# Patient Record
Sex: Male | Born: 1990
Health system: Southern US, Community
[De-identification: ages and names within clinical notes are randomized; demographics above are authoritative.]

## PROBLEM LIST (undated history)

## (undated) ENCOUNTER — Emergency Department (HOSPITAL_COMMUNITY): Admission: EM | Discharge status: Absent without leave | Payer: Self-pay

## (undated) DIAGNOSIS — R519 Headache, unspecified: Secondary | ICD-10-CM

## (undated) DIAGNOSIS — R51 Headache: Secondary | ICD-10-CM

## (undated) HISTORY — DX: Headache: R51

## (undated) HISTORY — DX: Headache, unspecified: R51.9

---

## 2013-01-21 ENCOUNTER — Encounter (HOSPITAL_COMMUNITY): Payer: Self-pay

## 2013-01-21 ENCOUNTER — Emergency Department (HOSPITAL_COMMUNITY)
Admission: EM | Admit: 2013-01-21 | Discharge: 2013-01-21 | Disposition: A | Discharge status: Home / Self Care | Payer: Self-pay | Attending: Emergency Medicine | Admitting: Emergency Medicine

## 2013-01-21 DIAGNOSIS — R51 Headache: Secondary | ICD-10-CM | POA: Insufficient documentation

## 2013-01-21 DIAGNOSIS — K2970 Gastritis, unspecified, without bleeding: Secondary | ICD-10-CM | POA: Insufficient documentation

## 2013-01-21 LAB — CBC WITH DIFFERENTIAL/PLATELET
Eosinophils Absolute: 0.1 10*3/uL (ref 0.0–0.7)
Eosinophils Relative: 1 % (ref 0–5)
Lymphs Abs: 2.6 10*3/uL (ref 0.7–4.0)
MCH: 29.7 pg (ref 26.0–34.0)
MCHC: 33.3 g/dL (ref 30.0–36.0)
MCV: 89.4 fL (ref 78.0–100.0)
Platelets: 212 10*3/uL (ref 150–400)
RBC: 4.64 MIL/uL (ref 4.22–5.81)

## 2013-01-21 LAB — COMPREHENSIVE METABOLIC PANEL
ALT: 10 U/L (ref 0–53)
BUN: 5 mg/dL — ABNORMAL LOW (ref 6–23)
Calcium: 10 mg/dL (ref 8.4–10.5)
GFR calc Af Amer: >90 mL/min (ref >90)
Glucose, Bld: 112 mg/dL — ABNORMAL HIGH (ref 70–99)
Sodium: 139 mEq/L (ref 135–145)
Total Protein: 7.4 g/dL (ref 6.0–8.3)

## 2013-01-21 LAB — URINALYSIS W MICROSCOPIC + REFLEX CULTURE
Leukocytes, UA: NEGATIVE
Nitrite: NEGATIVE
Protein, ur: NEGATIVE mg/dL
Urine-Other: NONE SEEN
Urobilinogen, UA: 0.2 mg/dL (ref 0.0–1.0)

## 2013-01-21 LAB — LIPASE, BLOOD: Lipase: 16 U/L (ref 11–59)

## 2013-01-21 MED ORDER — ASPIRIN-ACETAMINOPHEN-CAFFEINE 250-250-65 MG PO TABS: 1.0000 | ORAL_TABLET | Freq: Four times a day (QID) | ORAL | Status: DC | PRN

## 2013-01-21 MED ORDER — ONDANSETRON 8 MG PO TBDP
8.0000 mg | ORAL_TABLET | Freq: Once | ORAL | Status: AC
  Administered 2013-01-21: 8 mg via ORAL
  Filled 2013-01-21: qty 1

## 2013-01-21 MED ORDER — GI COCKTAIL ~~LOC~~
30.0000 mL | Freq: Once | ORAL | Status: AC
  Administered 2013-01-21: 30 mL via ORAL
  Filled 2013-01-21: qty 30

## 2013-01-21 MED ORDER — FAMOTIDINE 20 MG PO TABS: 20.0000 mg | ORAL_TABLET | Freq: Two times a day (BID) | ORAL | Status: DC

## 2013-01-21 NOTE — ED Provider Notes (Signed)
Medical screening examination/treatment/procedure(s) were performed by non-physician practitioner and as supervising physician I was immediately available for consultation/collaboration. Devoria Albe, MD, Armando Gang   Ward Givens, MD 01/21/13 2001

## 2013-01-21 NOTE — ED Notes (Signed)
Per [facility]- pt reports stomach pain since this am. Headache x 2 days Motrin and tylenol without relief. C/o of nausea only.  Denies V/D and fever.

## 2013-01-21 NOTE — ED Provider Notes (Signed)
CSN: [id number]     Arrival date & time 01/21/13  1413 History   First MD Initiated Contact with Patient 01/21/13 1723     Chief Complaint  Patient presents with  . Abdominal Pain    this am  . Headache    x 2 days   (Consider location/radiation/quality/duration/timing/severity/associated sxs/prior Treatment) HPI Patient is a 22 year old male who presents to emergency department with complaint of headache and abdominal pain. Should states his headache began gradually several days ago. States yesterday he was taking Tylenol and Advil for pain. States took about 10 Tylenol as well as Tylenol sinus and took about 10 and had rales and states he had no improvement in his headache. Later on that night he developed generalized abdominal pain, nausea, loose bowels. States he is able to go to bed however this morning when he woke up he continued to have abdominal pain and headache. Patient states since being in the waiting room his headache and abdominal pain subsided. He denies any fever, chills, vomiting. Denies any blood in his stool. Denies any history of the same. He did not take any medications for his nausea. States nothing makes his symptoms better or worse. History reviewed. No pertinent past medical history. History reviewed. No pertinent past surgical history. No family history on file. History  Substance Use Topics  . Smoking status: Never Smoker   . Smokeless tobacco: Not on file  . Alcohol Use: No    Review of Systems  Constitutional: Negative for fever and chills.  HENT: Negative for neck pain and neck stiffness.   Respiratory: Negative for cough, chest tightness and shortness of breath.   Cardiovascular: Negative for chest pain, palpitations and leg swelling.  Gastrointestinal: Positive for nausea and abdominal pain. Negative for vomiting, diarrhea and abdominal distention.  Genitourinary: Negative for dysuria, urgency, frequency and hematuria.  Musculoskeletal: Negative for  myalgias and arthralgias.  Skin: Negative for rash.  Allergic/Immunologic: Negative for immunocompromised state.  Neurological: Positive for headaches. Negative for dizziness, weakness, light-headedness and numbness.    Allergies  Review of patient's allergies indicates no known allergies.  Home Medications   Current Outpatient Rx  Name  Route  Sig  Dispense  Refill  . ibuprofen (ADVIL,MOTRIN) 200 MG tablet   Oral   Take 800 mg by mouth every 8 (eight) hours as needed for pain.           BP 139/79  Pulse 72  Temp(Src) 98.6 F (37 C) (Oral)  Resp 16  Ht 5\' 8"  (1.727 m)  Wt 150 lb (68.04 kg)  BMI 22.81 kg/m2  SpO2 100% Physical Exam  Nursing note and vitals reviewed. Constitutional: He is oriented to person, place, and time. He appears well-developed and well-nourished. No distress.  HENT:  Head: Normocephalic and atraumatic.  Mouth/Throat: Oropharynx is clear and moist.  Eyes: Conjunctivae are normal.  Neck: Normal range of motion. Neck supple.  Cardiovascular: Normal rate, regular rhythm and normal heart sounds.   Pulmonary/Chest: Effort normal and breath sounds normal. No respiratory distress. He has no wheezes. He has no rales.  Abdominal: Soft. Bowel sounds are normal. He exhibits no distension. There is tenderness. There is no rebound and no guarding.  Epigastric tenderness  Musculoskeletal: He exhibits no edema and no tenderness.  Lymphadenopathy:    He has no cervical adenopathy.  Neurological: He is alert and oriented to person, place, and time.  Skin: Skin is warm and dry. No erythema.  Psychiatric: He has a normal mood  and affect.    ED Course  Procedures (including critical care time) Results for orders placed during the hospital encounter of 01/21/13  CBC WITH DIFFERENTIAL      Result Value Range   WBC 7.0  4.0 - 10.5 K/uL   RBC 4.64  4.22 - 5.81 MIL/uL   Hemoglobin 13.8  13.0 - 17.0 g/dL   HCT 16.1  09.6 - 04.5 %   MCV 89.4  78.0 - 100.0 fL   MCH  29.7  26.0 - 34.0 pg   MCHC 33.3  30.0 - 36.0 g/dL   RDW 40.9  81.1 - 91.4 %   Platelets 212  150 - 400 K/uL   Neutrophils Relative % 58  43 - 77 %   Neutro Abs 4.1  1.7 - 7.7 K/uL   Lymphocytes Relative 37  12 - 46 %   Lymphs Abs 2.6  0.7 - 4.0 K/uL   Monocytes Relative 3  3 - 12 %   Monocytes Absolute 0.2  0.1 - 1.0 K/uL   Eosinophils Relative 1  0 - 5 %   Eosinophils Absolute 0.1  0.0 - 0.7 K/uL   Basophils Relative 1  0 - 1 %   Basophils Absolute 0.1  0.0 - 0.1 K/uL  COMPREHENSIVE METABOLIC PANEL      Result Value Range   Sodium 139  135 - 145 mEq/L   Potassium 3.6  3.5 - 5.1 mEq/L   Chloride 102  96 - 112 mEq/L   CO2 31  19 - 32 mEq/L   Glucose, Bld 112 (*) 70 - 99 mg/dL   BUN 5 (*) 6 - 23 mg/dL   Creatinine, Ser 7.82  0.50 - 1.35 mg/dL   Calcium 95.6  8.4 - 21.3 mg/dL   Total Protein 7.4  6.0 - 8.3 g/dL   Albumin 4.1  3.5 - 5.2 g/dL   AST 20  0 - 37 U/L   ALT 10  0 - 53 U/L   Alkaline Phosphatase 63  39 - 117 U/L   Total Bilirubin 0.4  0.3 - 1.2 mg/dL   GFR calc non Af Amer >90  >90 mL/min   GFR calc Af Amer >90  >90 mL/min  LIPASE, BLOOD      Result Value Range   Lipase 16  11 - 59 U/L  ACETAMINOPHEN LEVEL      Result Value Range   Acetaminophen (Tylenol), Serum <15.0  10 - 30 ug/mL  URINALYSIS W MICROSCOPIC + REFLEX CULTURE      Result Value Range   Color, Urine YELLOW  YELLOW   APPearance CLEAR  CLEAR   Specific Gravity, Urine 1.013  1.005 - 1.030   pH 6.0  5.0 - 8.0   Glucose, UA 100 (*) NEGATIVE mg/dL   Hgb urine dipstick NEGATIVE  NEGATIVE   Bilirubin Urine NEGATIVE  NEGATIVE   Ketones, ur NEGATIVE  NEGATIVE mg/dL   Protein, ur NEGATIVE  NEGATIVE mg/dL   Urobilinogen, UA 0.2  0.0 - 1.0 mg/dL   Nitrite NEGATIVE  NEGATIVE   Leukocytes, UA NEGATIVE  NEGATIVE   Urine-Other       Value: NO FORMED ELEMENTS SEEN ON URINE MICROSCOPIC EXAMINATION   No results found.    MDM   1. Headache   2. Gastritis     A shunt with headache and epigastric  abdominal pain. He took approximately 10-15 Tylenol and 10 ibuprofen sister for his headache. His headache subsided last night however he returned  today. His headache is gradual in onset. It is not associated with any neurological symptoms. He has no meningismus. He is afebrile. Patient's abdominal pain is most likely due to overmedication and at present he does not have any pain. His abdomen is benign. He'll be discharged with pain medication for his headache we'll start him on Pepcid. He will followup with primary care Dr. Has also noted that he had some glucose in his urine his blood sugar here was in 112. Informed patient and will need recheck with primary care Dr.  Ceasar Mons Vitals:   01/21/13 1416 01/21/13 1424 01/21/13 1733 01/21/13 1914  BP:  139/79 117/92 113/72  Pulse:  72 64 62  Temp:  98.6 F (37 C) 97.6 F (36.4 C) 98.2 F (36.8 C)  TempSrc:  Oral Oral Oral  Resp:  16 18 18   Height:  5\' 8"  (1.727 m)    Weight:  150 lb (68.04 kg)    SpO2: 98% 100% 100% 100%       Lottie Mussel, PA-C 01/21/13 1955

## 2015-05-21 ENCOUNTER — Emergency Department (HOSPITAL_COMMUNITY)
Admission: EM | Admit: 2015-05-21 | Discharge: 2015-05-21 | Disposition: A | Discharge status: Home / Self Care | Payer: No Typology Code available for payment source | Attending: Emergency Medicine | Admitting: Emergency Medicine

## 2015-05-21 ENCOUNTER — Emergency Department (HOSPITAL_COMMUNITY): Payer: No Typology Code available for payment source

## 2015-05-21 DIAGNOSIS — Y92410 Unspecified street and highway as the place of occurrence of the external cause: Secondary | ICD-10-CM | POA: Diagnosis not present

## 2015-05-21 DIAGNOSIS — S199XXA Unspecified injury of neck, initial encounter: Secondary | ICD-10-CM | POA: Diagnosis not present

## 2015-05-21 DIAGNOSIS — Y9389 Activity, other specified: Secondary | ICD-10-CM | POA: Insufficient documentation

## 2015-05-21 DIAGNOSIS — Y998 Other external cause status: Secondary | ICD-10-CM | POA: Insufficient documentation

## 2015-05-21 DIAGNOSIS — S0990XA Unspecified injury of head, initial encounter: Secondary | ICD-10-CM | POA: Insufficient documentation

## 2015-05-21 DIAGNOSIS — S79922A Unspecified injury of left thigh, initial encounter: Secondary | ICD-10-CM | POA: Diagnosis not present

## 2015-05-21 DIAGNOSIS — M542 Cervicalgia: Secondary | ICD-10-CM

## 2015-05-21 DIAGNOSIS — M25562 Pain in left knee: Secondary | ICD-10-CM

## 2015-05-21 DIAGNOSIS — S8992XA Unspecified injury of left lower leg, initial encounter: Secondary | ICD-10-CM | POA: Insufficient documentation

## 2015-05-21 MED ORDER — CYCLOBENZAPRINE HCL 10 MG PO TABS: 10.0000 mg | ORAL_TABLET | Freq: Two times a day (BID) | ORAL | Status: DC | PRN

## 2015-05-21 MED ORDER — IBUPROFEN 800 MG PO TABS
800.0000 mg | ORAL_TABLET | Freq: Once | ORAL | Status: AC
  Administered 2015-05-21: 800 mg via ORAL
  Filled 2015-05-21: qty 1
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
## 2015-05-21 NOTE — ED Notes (Signed)
Pt verbalized understanding of d/c instructions, prescriptions, and follow-up care. No further questions/concerns, VSS, ambulatory w/ steady gait (refused wheelchair) 
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
## 2015-07-28 ENCOUNTER — Encounter (HOSPITAL_COMMUNITY): Payer: Self-pay | Admitting: Vascular Surgery

## 2015-07-28 ENCOUNTER — Emergency Department (HOSPITAL_COMMUNITY): Payer: No Typology Code available for payment source

## 2015-07-28 ENCOUNTER — Emergency Department (HOSPITAL_COMMUNITY)
Admission: EM | Admit: 2015-07-28 | Discharge: 2015-07-28 | Disposition: A | Discharge status: Home / Self Care | Payer: No Typology Code available for payment source | Attending: Emergency Medicine | Admitting: Emergency Medicine

## 2015-07-28 DIAGNOSIS — S60221A Contusion of right hand, initial encounter: Secondary | ICD-10-CM | POA: Insufficient documentation

## 2015-07-28 DIAGNOSIS — S62316A Displaced fracture of base of fifth metacarpal bone, right hand, initial encounter for closed fracture: Secondary | ICD-10-CM | POA: Insufficient documentation

## 2015-07-28 DIAGNOSIS — Y9389 Activity, other specified: Secondary | ICD-10-CM | POA: Insufficient documentation

## 2015-07-28 DIAGNOSIS — Y9289 Other specified places as the place of occurrence of the external cause: Secondary | ICD-10-CM | POA: Insufficient documentation

## 2015-07-28 DIAGNOSIS — S62306A Unspecified fracture of fifth metacarpal bone, right hand, initial encounter for closed fracture: Secondary | ICD-10-CM

## 2015-07-28 DIAGNOSIS — W010XXA Fall on same level from slipping, tripping and stumbling without subsequent striking against object, initial encounter: Secondary | ICD-10-CM | POA: Insufficient documentation

## 2015-07-28 DIAGNOSIS — Y999 Unspecified external cause status: Secondary | ICD-10-CM | POA: Insufficient documentation

## 2015-07-28 DIAGNOSIS — S62324A Displaced fracture of shaft of fourth metacarpal bone, right hand, initial encounter for closed fracture: Secondary | ICD-10-CM | POA: Insufficient documentation

## 2015-07-28 DIAGNOSIS — S62304A Unspecified fracture of fourth metacarpal bone, right hand, initial encounter for closed fracture: Secondary | ICD-10-CM

## 2015-07-28 MED ORDER — IBUPROFEN 800 MG PO TABS: 800.0000 mg | ORAL_TABLET | Freq: Three times a day (TID) | ORAL | Status: DC

## 2015-07-28 MED ORDER — OXYCODONE-ACETAMINOPHEN 5-325 MG PO TABS: 1.0000 | ORAL_TABLET | ORAL | Status: DC | PRN

## 2015-07-28 MED ORDER — OXYCODONE-ACETAMINOPHEN 5-325 MG PO TABS
2.0000 | ORAL_TABLET | Freq: Once | ORAL | Status: AC
  Administered 2015-07-28: 2 via ORAL
  Filled 2015-07-28: qty 2

## 2015-07-28 NOTE — Progress Notes (Signed)
Orthopedic Tech Progress Note Patient Details:  Shawn MoloneyJefferson Winberg 03/22/1991 [id number]  Ortho Devices Type of Ortho Device: Ace wrap, Ulna gutter splint Ortho Device/Splint Location: rue Ortho Device/Splint Interventions: Application   Francoise Chojnowski 07/28/2015, 5:16 PM

## 2015-07-28 NOTE — Discharge Instructions (Signed)
Metacarpal Fracture °A metacarpal fracture is a break (fracture) of a bone in the hand. Metacarpals are the bones that extend from your knuckles to your wrist. In each hand, you have five metacarpal bones that connect your fingers and your thumb to your wrist. °Some hand fractures have bone pieces that are close together and stable (simple). These fractures may be treated with only a splint or cast. Hand fractures that have many pieces of broken bone (comminuted), unstable bone pieces (displaced), or a bone that breaks through the skin (compound) usually require surgery. °CAUSES °This injury may be caused by: °· A fall. °· A hard, direct hit to your hand. °· An injury that squeezes your knuckle, stretches your finger out of place, or crushes your hand. °RISK FACTORS °This injury is more likely to occur if: °· You play contact sports. °· You have certain bone diseases. °SYMPTOMS  °Symptoms of this type of fracture develop soon after the injury. Symptoms may include: °· Swelling. °· Pain. °· Stiffness. °· Increased pain with movement. °· Bruising. °· Inability to move a finger. °· A shortened finger. °· A finger knuckle that looks sunken in. °· Unusual appearance of the hand or finger (deformity). °DIAGNOSIS  °This injury may be diagnosed based on your signs and symptoms, especially if you had a recent hand injury. Your health care provider will perform a physical exam. He or she may also order X-rays to confirm the diagnosis.  °TREATMENT  °Treatment for this injury depends on the type of fracture you have and how severe it is. Possible treatments include: °· Non-reduction. This can be done if the bone does not need to be moved back into place. The fracture can be casted or splinted as it is.   °· Closed reduction. If your bone is stable and can be moved back into place, you may only need to wear a cast or splint or have buddy taping. °· Closed reduction with internal fixation (CRIF). This is the most common  treatment. You may have this procedure if your bone can be moved back into place but needs more support. Wires, pins, or screws may be inserted through your skin to stabilize the fracture. °· Open reduction with internal fixation (ORIF). This may be needed if your fracture is severe and unstable. It involves surgery to move your bone back into the right position. Screws, wires, or plates are used to stabilize the fracture. °After all procedures, you may need to wear a cast or a splint for several weeks. You will also need to have follow-up X-rays to make sure that the bone is healing well and staying in position. After you no longer need your cast or splint, you may need physical therapy. This will help you to regain full movement and strength in your hand.  °HOME CARE INSTRUCTIONS  °If You Have a Cast: °· Do not stick anything inside the cast to scratch your skin. Doing that increases your risk of infection. °· Check the skin around the cast every day. Report any concerns to your health care provider. You may put lotion on dry skin around the edges of the cast. Do not apply lotion to the skin underneath the cast. °If You Have a Splint: °· Wear it as directed by your health care provider. Remove it only as directed by your health care provider. °· Loosen the splint if your fingers become numb and tingle, or if they turn cold and blue. °Bathing °· Cover the cast or splint with a   watertight plastic bag to protect it from water while you take a bath or a shower. Do not let the cast or splint get wet. Managing Pain, Stiffness, and Swelling  If directed, apply ice to the injured area (if you have a splint, not a cast):  Put ice in a plastic bag.  Place a towel between your skin and the bag.  Leave the ice on for 20 minutes, 2-3 times a day.  Move your fingers often to avoid stiffness and to lessen swelling.  Raise the injured area above the level of your heart while you are sitting or lying  down. Driving  Do not drive or operate heavy machinery while taking pain medicine.  Do not drive while wearing a cast or splint on a hand that you use for driving. Activity  Return to your normal activities as directed by your health care provider. Ask your health care provider what activities are safe for you. General Instructions  Do not put pressure on any part of the cast or splint until it is fully hardened. This may take several hours.  Keep the cast or splint clean and dry.  Do not use any tobacco products, including cigarettes, chewing tobacco, or electronic cigarettes. Tobacco can delay bone healing. If you need help quitting, ask your health care provider.  Take medicines only as directed by your health care provider.  Keep all follow-up visits as directed by your health care provider. This is important. SEEK MEDICAL CARE IF:   Your pain is getting worse.  You have redness, swelling, or pain in the injured area.   You have fluid, blood, or pus coming from under your cast or splint.   You notice a bad smell coming from under your cast or splint.   You have a fever.  SEEK IMMEDIATE MEDICAL CARE IF:   You develop a rash.   You have trouble breathing.   Your skin or nails on your injured hand turn blue or gray even after you loosen your splint.  Your injured hand feels cold or becomes numb even after you loosen your splint.   You develop severe pain under the cast or in your hand.   This information is not intended to replace advice given to you by your health care provider. Make sure you discuss any questions you have with your health care provider.   Document Released: 05/08/2005 Document Revised: 01/27/2015 Document Reviewed: 02/25/2014 Elsevier Interactive Patient Education 2016 [employer] Inc[employer]  Cast or Splint Care Casts and splints support injured limbs and keep bones from moving while they heal. It is important to care for your cast or splint at  home.  HOME CARE INSTRUCTIONS  Keep the cast or splint uncovered during the drying period. It can take 24 to 48 hours to dry if it is made of plaster. A fiberglass cast will dry in less than 1 hour.  Do not rest the cast on anything harder than a pillow for the first 24 hours.  Do not put weight on your injured limb or apply pressure to the cast until your health care provider gives you permission.  Keep the cast or splint dry. Wet casts or splints can lose their shape and may not support the limb as well. A wet cast that has lost its shape can also create harmful pressure on your skin when it dries. Also, wet skin can become infected.  Cover the cast or splint with a plastic bag when bathing or when out in  the rain or snow. If the cast is on the trunk of the body, take sponge baths until the cast is removed.  If your cast does become wet, dry it with a towel or a blow dryer on the cool setting only.  Keep your cast or splint clean. Soiled casts may be wiped with a moistened cloth.  Do not place any hard or soft foreign objects under your cast or splint, such as cotton, toilet paper, lotion, or powder.  Do not try to scratch the skin under the cast with any object. The object could get stuck inside the cast. Also, scratching could lead to an infection. If itching is a problem, use a blow dryer on a cool setting to relieve discomfort.  Do not trim or cut your cast or remove padding from inside of it.  Exercise all joints next to the injury that are not immobilized by the cast or splint. For example, if you have a long leg cast, exercise the hip joint and toes. If you have an arm cast or splint, exercise the shoulder, elbow, thumb, and fingers.  Elevate your injured arm or leg on 1 or 2 pillows for the first 1 to 3 days to decrease swelling and pain.It is best if you can comfortably elevate your cast so it is higher than your heart. SEEK MEDICAL CARE IF:   Your cast or splint  cracks.  Your cast or splint is too tight or too loose.  You have unbearable itching inside the cast.  Your cast becomes wet or develops a soft spot or area.  You have a bad smell coming from inside your cast.  You get an object stuck under your cast.  Your skin around the cast becomes red or raw.  You have new pain or worsening pain after the cast has been applied. SEEK IMMEDIATE MEDICAL CARE IF:   You have fluid leaking through the cast.  You are unable to move your fingers or toes.  You have discolored (blue or white), cool, painful, or very swollen fingers or toes beyond the cast.  You have tingling or numbness around the injured area.  You have severe pain or pressure under the cast.  You have any difficulty with your breathing or have shortness of breath.  You have chest pain.   This information is not intended to replace advice given to you by your health care provider. Make sure you discuss any questions you have with your health care provider.   Document Released: 05/05/2000 Document Revised: 02/26/2013 Document Reviewed: 11/14/2012 Elsevier Interactive Patient Education 2016 Else[employer] Inc[employer] RICE for Routine Care of Injuries Theroutine careofmanyinjuriesincludes rest, ice, compression, and elevation (RICE therapy). RICE therapy is often recommended for injuries to soft tissues, such as a muscle strain, ligament injuries, bruises, and overuse injuries. It can also be used for some bony injuries. Using RICE therapy can help to relieve pain, lessen swelling, and enable your body to heal. Rest Rest is required to allow your body to heal. This usually involves reducing your normal activities and avoiding use of the injured part of your body. Generally, you can return to your normal activities when you are comfortable and have been given permission by your health care provider. Ice Icing your injury helps to keep the swelling down, and it lessens pain. Do not apply ice  directly to your skin.  Put ice in a plastic bag.  Place a towel between your skin and the bag.  Leave the ice on  for 20 minutes, 2-3 times a day. Do this for as long as you are directed by your health care provider. Compression Compression means putting pressure on the injured area. Compression helps to keep swelling down, gives support, and helps with discomfort. Compression may be done with an elastic bandage. If an elastic bandage has been applied, follow these general tips:  Remove and reapply the bandage every 3-4 hours or as directed by your health care provider.  Make sure the bandage is not wrapped too tightly, because this can cut off circulation. If part of your body beyond the bandage becomes blue, numb, cold, swollen, or more painful, your bandage is most likely too tight. If this occurs, remove your bandage and reapply it more loosely.  See your health care provider if the bandage seems to be making your problems worse rather than better. Elevation Elevation means keeping the injured area raised. This helps to lessen swelling and decrease pain. If possible, your injured area should be elevated at or above the level of your heart or the center of your chest. WHEN SHOULD I SEEK MEDICAL CARE? You should seek medical care if:  Your pain and swelling continue.  Your symptoms are getting worse rather than improving. These symptoms may indicate that further evaluation or further X-rays are needed. Sometimes, X-rays may not show a small broken bone (fracture) until a number of days later. Make a follow-up appointment with your health care provider. WHEN SHOULD I SEEK IMMEDIATE MEDICAL CARE? You should seek immediate medical care if:  You have sudden severe pain at or below the area of your injury.  You have redness or increased swelling around your injury.  You have tingling or numbness at or below the area of your injury that does not improve after you remove the elastic  bandage.   This information is not intended to replace advice given to you by your health care provider. Make sure you discuss any questions you have with your health care provider.   Document Released: 08/20/2000 Document Revised: 01/27/2015 Document Reviewed: 04/15/2014 Elsevier Interactive Patient Education [employer]

## 2015-07-28 NOTE — ED Provider Notes (Signed)
CSN: [id number]     Arrival date & time 07/28/15  1447 History  By signing my name below, I, Emmanuella Mensah, attest that this documentation has been prepared under the direction and in the presence of Danelle Berry, PA-C. Electronically Signed: Angelene Giovanni, ED Scribe. 07/28/2015. 4:19 PM.    Chief Complaint  Patient presents with  . Hand Pain   The history is provided by the patient. No language interpreter was used.   HPI Comments: Shawn Werner is a 25 y.o. male who presents to the Emergency Department complaining of gradually worsening constant 7/10 right hand pain s/p fall that occurred last night. He reports associated swelling and decreased ROM secondary to pain. He explains that he was carrying something in his hand when he missed a step, causing him to fall on his right hand. He states that he took Ibuprofen with no relief. He denies any fever, open wounds, numbness, tingling, or weakness.    History reviewed. No pertinent past medical history. History reviewed. No pertinent past surgical history. No family history on file. Social History  Substance Use Topics  . Smoking status: Never Smoker   . Smokeless tobacco: Never Used  . Alcohol Use: Yes     Comment: occasionally    Review of Systems  Constitutional: Negative for fever and chills.  Musculoskeletal: Positive for joint swelling (right hand) and arthralgias (right hand).  Skin: Negative for wound.  Neurological: Negative for weakness and numbness.  All other systems reviewed and are negative.     Allergies  Review of patient's allergies indicates no known allergies.  Home Medications   Prior to Admission medications   Medication Sig Start Date End Date Taking? Authorizing Provider  acetaminophen (TYLENOL) 325 MG tablet Take 650 mg by mouth every 6 (six) hours as needed for headache (pain).    Historical Provider, MD  cyclobenzaprine (FLEXERIL) 10 MG tablet Take 1 tablet (10 mg total) by mouth 2 (two)  times daily as needed for muscle spasms. 05/21/15   Eyvonne Mechanic, PA-C  ibuprofen (ADVIL,MOTRIN) 800 MG tablet Take 1 tablet (800 mg total) by mouth 3 (three) times daily. 07/28/15   Danelle Berry, PA-C  oxyCODONE-acetaminophen (PERCOCET) 5-325 MG tablet Take 1 tablet by mouth every 4 (four) hours as needed. 07/28/15   Danelle Berry, PA-C   BP 122/75 mmHg  Pulse 83  Temp(Src) 97.5 F (36.4 C) (Oral)  Resp 14  SpO2 99% Physical Exam  Constitutional: He is oriented to person, place, and time. He appears well-developed and well-nourished. No distress.  HENT:  Head: Normocephalic and atraumatic.  Eyes: Conjunctivae and EOM are normal.  Neck: Neck supple. No tracheal deviation present.  Cardiovascular: Normal rate.   Pulmonary/Chest: Effort normal. No respiratory distress.  Musculoskeletal:       Right hand: He exhibits decreased range of motion, tenderness, bony tenderness and swelling. He exhibits normal capillary refill. Normal sensation noted.  Right hand: Normal cap refill, Normal sensation to light touch, 2+ radial pulse and palpable ulnar pulse.   Neurological: He is alert and oriented to person, place, and time.  Skin: Skin is warm and dry.  Psychiatric: He has a normal mood and affect. His behavior is normal.  Nursing note and vitals reviewed.   ED Course  Procedures (including critical care time) DIAGNOSTIC STUDIES: Oxygen Saturation is 97% on RA, normal by my interpretation.    COORDINATION OF CARE: 4:17 PM- Pt advised of plan for treatment and pt agrees. Pt informed of his x-ray results.  He will receive Percocet and immobilizer. Advised to ice it and keep arm elevated. He provide resources to follow up with Ortho for further treatment. Pt will receive a work note.    Imaging Review Dg Hand Complete Right  07/28/2015  CLINICAL DATA:  Larey SeatFell last the, injury right hand. Pain, swelling and bruising EXAM: RIGHT HAND - COMPLETE 3+ VIEW COMPARISON:  None FINDINGS: Fractures are noted  through the midportion of the right fourth metacarpal and proximal the right fifth metacarpal. Mild displacement. Overlying soft tissue swelling. IMPRESSION: Fractures through the fourth and fifth metacarpals. Electronically Signed   By: Charlett NoseKevin  Dover M.D.   On: 07/28/2015 15:32     Danelle BerryLeisa Dymon [place], PA-C has personally reviewed and evaluated these images as part of her medical decision-making.  MDM   Right hand injuryFrom a mechanical fall that occurred last night. Patient states he was carrying a bunch of stuff when he tripped and fell and his right arm landed underneath him he cannot describe the mechanism and the more details than that. Patient's x-rays reveal a mildly displaced right fourth and fifth metacarpal fracture. He is neurovascularly intact with diffuse swelling, bruising and tenderness.  He was placed in a ulnar gutter splint in the ER, given pain medications and ortho follow-up. He was discharged in good condition  Final diagnoses:  Fracture of fifth metacarpal bone of right hand, closed, initial encounter  Fracture of fourth metacarpal bone of right hand, closed, initial encounter   I personally performed the services described in this documentation, which was scribed in my presence. The recorded information has been reviewed and is accurate.     Danelle BerryLeisa Per Beagley, PA-C 07/30/15 1805  Alvira MondayErin Schlossman, MD 07/30/15 1952

## 2015-07-28 NOTE — ED Notes (Signed)
Pt reports to the ED for eval of right hand pain and swelling following a fall that occurred last pm. Right hand significantly swollen. CMS intact. Finger movement limited by swelling. Pt A&Ox4 resp e/u, and skin warm and dry.

## 2015-07-28 NOTE — ED Notes (Signed)
Pt ambulates independently and with steady gait at time of discharge. Discharge instructions and follow up information reviewed with patient. No other questions or concerns voiced at this time. RX x 2. 

## 2015-08-16 ENCOUNTER — Ambulatory Visit: Payer: Self-pay | Attending: Internal Medicine

## 2015-08-19 ENCOUNTER — Ambulatory Visit: Payer: Self-pay | Attending: Physician Assistant | Admitting: Physician Assistant

## 2015-08-19 VITALS — BP 112/77 | HR 66 | Temp 97.9°F | Resp 18 | Ht 69.0 in | Wt 181.0 lb

## 2015-08-19 DIAGNOSIS — S62606A Fracture of unspecified phalanx of right little finger, initial encounter for closed fracture: Secondary | ICD-10-CM | POA: Insufficient documentation

## 2015-08-19 DIAGNOSIS — Z79899 Other long term (current) drug therapy: Secondary | ICD-10-CM | POA: Insufficient documentation

## 2015-08-19 DIAGNOSIS — S62604A Fracture of unspecified phalanx of right ring finger, initial encounter for closed fracture: Secondary | ICD-10-CM | POA: Insufficient documentation

## 2015-08-19 DIAGNOSIS — S62308A Unspecified fracture of other metacarpal bone, initial encounter for closed fracture: Secondary | ICD-10-CM

## 2015-08-19 DIAGNOSIS — W19XXXA Unspecified fall, initial encounter: Secondary | ICD-10-CM | POA: Insufficient documentation

## 2015-08-19 NOTE — Patient Instructions (Signed)
Keep fingers wrapped until see by ortho Our office will be in touch with an appt

## 2015-08-19 NOTE — Progress Notes (Signed)
Patient is here for ED FU for R Metacarpal Fx  Patient denies pain at this time.  Patient has not taken any medications today.  Patient declined the flu shot

## 2015-08-19 NOTE — Progress Notes (Signed)
Shawn MoloneyJefferson Dade  ZOX:[id number]  [id number]  DOB - 03/19/1991  Chief Complaint  Patient presents with  . Follow-up    Right Metacarpal Fx       Subjective:   Shawn Werner is a 25 y.o. male here today for establishment of care. He presented to the emergency department on 07/28/2015 after sustaining a right hand injury from a mechanical fall that occurred the night prior. Patient states he was carrying a bunch of stuff when he tripped and fell and his right arm landed underneath him. He cannot describe the mechanism and the more details than that. Patient's x-rays revealed a mildly displaced right fourth and fifth metacarpal fracture of the right hand. He was neurovascularly intact with diffuse swelling, bruising and tenderness. He was placed in a ulnar gutter splint in the ER and given pain medications and told to come here for an ortho referral. He also was given NSAIDS and Percocet.   He did get the prescriptions filled. Feels a lot better. He is kept illness point on. He is currently not working. The pain is reduced to a 2 on a scale of 1-10. He still has decreased range of motion.  ROS: GEN: denies fever or chills, denies change in weight Skin: denies lesions or rashes EXT: denies muscle spasms + swelling; + pain in lower ext, no weakness NEURO: denies numbness or tingling, denies sz, stroke or TIA  ALLERGIES: No Known Allergies  PAST MEDICAL HISTORY: History reviewed. No pertinent past medical history.  PAST SURGICAL HISTORY: History reviewed. No pertinent past surgical history.  MEDICATIONS AT HOME: Prior to Admission medications   Medication Sig Start Date End Date Taking? Authorizing Provider  acetaminophen (TYLENOL) 325 MG tablet Take 650 mg by mouth every 6 (six) hours as needed for headache (pain).   Yes Historical Provider, MD  cyclobenzaprine (FLEXERIL) 10 MG tablet Take 1 tablet (10 mg total) by mouth 2 (two) times daily as needed for muscle  spasms. 05/21/15  Yes Jeffrey Hedges, PA-C  ibuprofen (ADVIL,MOTRIN) 800 MG tablet Take 1 tablet (800 mg total) by mouth 3 (three) times daily. 07/28/15  Yes Danelle BerryLeisa Tapia, PA-C  oxyCODONE-acetaminophen (PERCOCET) 5-325 MG tablet Take 1 tablet by mouth every 4 (four) hours as needed. 07/28/15  Yes Danelle BerryLeisa Tapia, PA-C     Objective:   Filed Vitals:   08/19/15 0941  BP: 112/77  Pulse: 66  Temp: 97.9 F (36.6 C)  TempSrc: Oral  Resp: 18  Height: 5\' 9"  (1.753 m)  Weight: 181 lb (82.101 kg)  SpO2: 100%    Exam General appearance : Awake, alert, not in any distress. Speech Clear. Not toxic looking HEENT: Atraumatic and Normocephalic, pupils equally reactive to light and accomodation Extremities: diffuse edema improved of right hand. Decreased ROM, bruising improved.  Neurology: Awake alert, and oriented X 3, CN II-XII intact, Non focal Wounds:N/A   Assessment & Plan  1. 4th and 5th Metacarpal Fracture of Right Hand   Cont Ulnar gutter splint Cont NSAIDS and prn pain meds Ortho referral Currently not working Return in about 4 weeks (around 09/16/2015). For routine health maintenance.  The patient was given clear instructions to go to ER or return to medical center if symptoms don't improve, worsen or new problems develop. The patient verbalized understanding. The patient was told to call to get lab results if they haven't heard anything in the next week.   This note has been created with Education officer, [employer]Dragon speech recognition software and smart phrase technology. Any transcriptional  errors are unintentional.    Scot Jun, PA-C [facility] [place], [place] [phone]   08/19/2015, 9:53 AM

## 2015-08-24 ENCOUNTER — Ambulatory Visit: Payer: Self-pay | Attending: Internal Medicine | Admitting: Internal Medicine

## 2015-08-24 ENCOUNTER — Encounter: Payer: Self-pay | Admitting: Internal Medicine

## 2015-08-24 VITALS — BP 122/79 | HR 67 | Temp 98.0°F | Resp 16 | Ht 69.0 in | Wt 177.0 lb

## 2015-08-24 DIAGNOSIS — S62309S Unspecified fracture of unspecified metacarpal bone, sequela: Secondary | ICD-10-CM

## 2015-08-24 MED ORDER — CYCLOBENZAPRINE HCL 5 MG PO TABS: 5.0000 mg | ORAL_TABLET | Freq: Three times a day (TID) | ORAL | Status: DC | PRN

## 2015-08-24 NOTE — Patient Instructions (Signed)
-   appt to ortho pending completion of [facility] ppwk - take motrin with food to prevent ulcers!!

## 2015-08-24 NOTE — Progress Notes (Signed)
   Shawn MoloneyJefferson Atchley, is a 25 y.o. male  ZOX:[id number]  [id number]  DOB - 03/10/1991  Chief Complaint  Patient presents with  . Hand Pain        Subjective:   Shawn Werner is a 25 y.o. male here today for a follow up visit.  Last seen in clinic 08/19/15 for 4th and 5th right MCP fracture after falling on his hand.  Back again for fu. Still working on his [facility], has not been able to see Ortho until that set up.  He does have his [employer] card now.   Pain tolerable, he took a motrin 800mg  this morning which helped.   Patient has No headache, No chest pain, No abdominal pain - No Nausea, No new weakness tingling or numbness, No Cough - SOB.  No problems updated.  ALLERGIES: No Known Allergies  PAST MEDICAL HISTORY: History reviewed. No pertinent past medical history.  MEDICATIONS AT HOME: Prior to Admission medications   Medication Sig Start Date End Date Taking? Authorizing Provider  acetaminophen (TYLENOL) 325 MG tablet Take 650 mg by mouth every 6 (six) hours as needed for headache (pain).    Historical Provider, MD  cyclobenzaprine (FLEXERIL) 5 MG tablet Take 1 tablet (5 mg total) by mouth 3 (three) times daily as needed for muscle spasms. 08/24/15   Pete Glatterawn T Langeland, MD  ibuprofen (ADVIL,MOTRIN) 800 MG tablet Take 1 tablet (800 mg total) by mouth 3 (three) times daily. 07/28/15   Danelle BerryLeisa Tapia, PA-C  oxyCODONE-acetaminophen (PERCOCET) 5-325 MG tablet Take 1 tablet by mouth every 4 (four) hours as needed. 07/28/15   Danelle BerryLeisa Tapia, PA-C     Objective:   Filed Vitals:   08/24/15 1443  BP: 122/79  Pulse: 67  Temp: 98 F (36.7 C)  Resp: 16  Height: 5\' 9"  (1.753 m)  Weight: 177 lb (80.287 kg)  SpO2: 100%    Exam General appearance : Awake, alert, not in any distress. Speech Clear. Not toxic looking, young.  HEENT: Atraumatic and Normocephalic, pupils equally reactive to light. Neck: supple, no JVD.  Chest:Good air entry bilaterally, no added  sounds. CVS: S1 S2 regular, no murmurs/gallups or rubs. Abdomen: Bowel sounds active, Non tender and not distended with no gaurding, rigidity or rebound. Extremities: B/L Lower Ext shows no edema, both legs are warm to touch.   Right hand in dressing/ Neurology: Awake alert, and oriented X 3, CN II-XII grossly intact, Non focal Skin:No Rash  Data Review No results found for: HGBA1C   Assessment & Plan   1) 4 & 5th metacarpal, right hand fracture - referral pending to ortho hand once pt completes his financial packet. - financial srvices, referral specialist assisting patient. - continue motrin prn w/ food for pain, added flexeril 5mg  prn for muscle spasms     Return in about 4 weeks (around 09/21/2015).  - pt states he has appt already set up for end of month for phsysical.  The patient was given clear instructions to go to ER or return to medical center if symptoms don't improve, worsen or new problems develop. The patient verbalized understanding. The patient was told to call to get lab results if they haven't heard anything in the next week.    Pete Glatterawn T Langeland, MD, MBA/MHA [facility] [place], [place] [phone]   08/24/2015, 3:21 PM

## 2015-08-24 NOTE — Progress Notes (Signed)
Patient complains of right hand pain  Fracture two bones in his hand about three weeks ago Currently has no pain medication

## 2015-09-13 ENCOUNTER — Ambulatory Visit: Payer: Self-pay | Admitting: Internal Medicine

## 2015-10-12 ENCOUNTER — Encounter: Payer: Self-pay | Admitting: Internal Medicine

## 2015-10-12 ENCOUNTER — Ambulatory Visit: Payer: Self-pay | Attending: Internal Medicine | Admitting: Internal Medicine

## 2015-10-12 VITALS — BP 113/71 | HR 69 | Temp 97.9°F | Wt 178.8 lb

## 2015-10-12 DIAGNOSIS — Z79891 Long term (current) use of opiate analgesic: Secondary | ICD-10-CM | POA: Insufficient documentation

## 2015-10-12 DIAGNOSIS — Z0000 Encounter for general adult medical examination without abnormal findings: Secondary | ICD-10-CM | POA: Insufficient documentation

## 2015-10-12 DIAGNOSIS — Z791 Long term (current) use of non-steroidal anti-inflammatories (NSAID): Secondary | ICD-10-CM | POA: Insufficient documentation

## 2015-10-12 DIAGNOSIS — S6291XD Unspecified fracture of right wrist and hand, subsequent encounter for fracture with routine healing: Secondary | ICD-10-CM | POA: Insufficient documentation

## 2015-10-12 DIAGNOSIS — X58XXXD Exposure to other specified factors, subsequent encounter: Secondary | ICD-10-CM | POA: Insufficient documentation

## 2015-10-12 DIAGNOSIS — Z79899 Other long term (current) drug therapy: Secondary | ICD-10-CM | POA: Insufficient documentation

## 2015-10-12 DIAGNOSIS — S6291XS Unspecified fracture of right wrist and hand, sequela: Secondary | ICD-10-CM

## 2015-10-12 DIAGNOSIS — K029 Dental caries, unspecified: Secondary | ICD-10-CM | POA: Insufficient documentation

## 2015-10-12 LAB — LIPID PANEL
Cholesterol: 204 mg/dL — ABNORMAL HIGH (ref 125–200)
HDL: 61 mg/dL (ref >=40)
LDL CALC: 123 mg/dL (ref <130)
Total CHOL/HDL Ratio: 3.3 Ratio (ref <=5.0)
Triglycerides: 102 mg/dL (ref <150)
VLDL: 20 mg/dL (ref <30)

## 2015-10-12 LAB — CBC
HEMATOCRIT: 44.8 % (ref 38.5–50.0)
HEMOGLOBIN: 14.8 g/dL (ref 13.2–17.1)
MCH: 29.2 pg (ref 27.0–33.0)
MCHC: 33 g/dL (ref 32.0–36.0)
MCV: 88.4 fL (ref 80.0–100.0)
MPV: 11.1 fL (ref 7.5–12.5)
Platelets: 276 10*3/uL (ref 140–400)
RBC: 5.07 MIL/uL (ref 4.20–5.80)
RDW: 13.9 % (ref 11.0–15.0)
WBC: 6.8 10*3/uL (ref 3.8–10.8)

## 2015-10-12 LAB — BASIC METABOLIC PANEL WITH GFR
BUN: 5 mg/dL — ABNORMAL LOW (ref 7–25)
CALCIUM: 10.5 mg/dL — AB (ref 8.6–10.3)
CO2: 28 mmol/L (ref 20–31)
CREATININE: 0.84 mg/dL (ref 0.60–1.35)
Chloride: 101 mmol/L (ref 98–110)
GFR, Est African American: >89 mL/min (ref >=60)
GFR, Est Non African American: >89 mL/min (ref >=60)
GLUCOSE: 75 mg/dL (ref 65–99)
Potassium: 4.4 mmol/L (ref 3.5–5.3)
SODIUM: 139 mmol/L (ref 135–146)

## 2015-10-12 NOTE — Progress Notes (Signed)
Shawn Werner, is a 25 y.o. male  [id number]  [id number]  DOB - 02/05/1991  Chief Complaint  Patient presents with  . Follow-up    R hand bone fx - doing much better  . Referral    Dental        Subjective:   Shawn Werner is a 25 y.o. male here today for a follow up visit of right hand fracture. He is doing very well, saw Orthopedic Doctor 2 wks ago, they took off the cast and told him he would not need surgery.  He denies any pain and able to use hand now w/o difficulty.  Pt o/w doing well, no acute complaints.  Asked to see dentist for chkup, thought he may need braces as well.  Doesn't smoke or drink.  Patient has No headache, No chest pain, No abdominal pain - No Nausea, No new weakness tingling or numbness, No Cough - SOB.  No problems updated.  ALLERGIES: No Known Allergies  PAST MEDICAL HISTORY: No past medical history on file.  MEDICATIONS AT HOME: Prior to Admission medications   Medication Sig Start Date End Date Taking? Authorizing Provider  acetaminophen (TYLENOL) 325 MG tablet Take 650 mg by mouth every 6 (six) hours as needed for headache (pain).   Yes Historical Provider, MD  ibuprofen (ADVIL,MOTRIN) 800 MG tablet Take 1 tablet (800 mg total) by mouth 3 (three) times daily. 07/28/15  Yes Danelle BerryLeisa Tapia, PA-C  cyclobenzaprine (FLEXERIL) 5 MG tablet Take 1 tablet (5 mg total) by mouth 3 (three) times daily as needed for muscle spasms. Patient not taking: Reported on 10/12/2015 08/24/15   Pete Glatterawn T Maverick Dieudonne, MD  oxyCODONE-acetaminophen (PERCOCET) 5-325 MG tablet Take 1 tablet by mouth every 4 (four) hours as needed. Patient not taking: Reported on 10/12/2015 07/28/15   Danelle BerryLeisa Tapia, PA-C     Objective:   Filed Vitals:   10/12/15 1132  BP: 113/71  Pulse: 69  Temp: 97.9 F (36.6 C)  TempSrc: Oral  Weight: 178 lb 12.8 oz (81.103 kg)    Exam General appearance : Awake, alert, not in any distress. Speech Clear. Not toxic looking, thin,  pleasant. HEENT: Atraumatic and Normocephalic, pupils equally reactive to light. Neck: supple, no JVD. Marland Kitchen.  Chest:Good air entry bilaterally, no added sounds. CVS: S1 S2 regular, no murmurs/gallups or rubs. Abdomen: Bowel sounds active, Non tender  Extremities: B/L Lower Ext shows no edema, both legs are warm to touch Neurology: Awake alert, and oriented X 3, CN II-XII grossly intact, Non focal Skin:No Rash  Data Review No results found for: HGBA1C  Dep[facility] screen [facility] 2/9 10/12/2015 08/19/2015  Decreased Interest 0 0  Down, Depressed, Hopeless 0 0  PHQ - 2 Score 0 0  Altered sleeping 0 -  Tired, decreased energy 0 -  Change in appetite 0 -  Feeling bad or failure about yourself  0 -  Trouble concentrating 0 -  Moving slowly or fidgety/restless 0 -  Suicidal thoughts 0 -  PHQ-9 Score 0 -  Difficult doing work/chores Not difficult at all -      Assessment & Plan   1. Annual physical exam Doing very well, currently fasting, will chk basic labs. - CBC - BASIC METABOLIC PANEL WITH GFR - TSH - Lipid panel  2. Dental cavities - no active infection. - Ambulatory referral to Dentistry  3. Right hand fracture, sequela Splint now off, pt saw ortho about 2 wks ago, and all is well.  No surgery required.  No residual pain. - resolved     Patient have been counseled extensively about nutrition and exercise  Return in about 1 year (around 10/11/2016), or if symptoms worsen or fail to improve. /prn  The patient was given clear instructions to go to ER or return to medical center if symptoms don't improve, worsen or new problems develop. The patient verbalized understanding. The patient was told to call to get lab results if they haven't heard anything in the next week.    Pete Glatter, MD, MBA/MHA [facility] and [facility] [place], [place] [phone]   10/12/2015, 11:51 AM

## 2015-10-12 NOTE — Patient Instructions (Signed)
Heart-Healthy Eating Plan Many factors influence your heart health, including eating and exercise habits. Heart (coronary) risk increases with abnormal blood fat (lipid) levels. Heart-healthy meal planning includes limiting unhealthy fats, increasing healthy fats, and making other small dietary changes. This includes maintaining a healthy body weight to help keep lipid levels within a normal range. WHAT IS MY PLAN?  Your health care provider recommends that you:  Get no more than 20% of the total calories in your daily diet from fat.  Limit the amount of cholesterol in your diet to less than _________ mg per day. WHAT TYPES OF FAT SHOULD I CHOOSE?  Choose healthy fats more often. Choose monounsaturated and polyunsaturated fats, such as olive oil and canola oil, flaxseeds, walnuts, almonds, and seeds.  Eat more omega-3 fats. Good choices include salmon, mackerel, sardines, tuna, flaxseed oil, and ground flaxseeds. Aim to eat fish at least two times each week.  Limit saturated fats. Saturated fats are primarily found in animal products, such as meats, butter, and cream. Plant sources of saturated fats include palm oil, palm kernel oil, and coconut oil.  Avoid foods with partially hydrogenated oils in them. These contain trans fats. Examples of foods that contain trans fats are stick margarine, some tub margarines, cookies, crackers, and other baked goods. WHAT GENERAL GUIDELINES DO I NEED TO FOLLOW?  Check food labels carefully to identify foods with trans fats or high amounts of saturated fat.  Fill one half of your plate with vegetables and green salads. Eat 4-5 servings of vegetables per day. A serving of vegetables equals 1 cup of raw leafy vegetables,  cup of raw or cooked cut-up vegetables, or  cup of vegetable juice.  Fill one fourth of your plate with whole grains. Look for the word "whole" as the first word in the ingredient list.  Fill one fourth of your plate with lean protein  foods.  Eat 4-5 servings of fruit per day. A serving of fruit equals one medium whole fruit,  cup of dried fruit,  cup of fresh, frozen, or canned fruit, or  cup of 100% fruit juice.  Eat more foods that contain soluble fiber. Examples of foods that contain this type of fiber are apples, broccoli, carrots, beans, peas, and barley. Aim to get 20-30 g of fiber per day.  Eat more home-cooked food and less restaurant, buffet, and fast food.  Limit or avoid alcohol.  Limit foods that are high in starch and sugar.  Avoid fried foods.  Cook foods by using methods other than frying. Baking, boiling, grilling, and broiling are all great options. Other fat-reducing suggestions include:  Removing the skin from poultry.  Removing all visible fats from meats.  Skimming the fat off of stews, soups, and gravies before serving them.  Steaming vegetables in water or broth.  Lose weight if you are overweight. Losing just 5-10% of your initial body weight can help your overall health and prevent diseases such as diabetes and heart disease.  Increase your consumption of nuts, legumes, and seeds to 4-5 servings per week. One serving of dried beans or legumes equals  cup after being cooked, one serving of nuts equals 1 ounces, and one serving of seeds equals  ounce or 1 tablespoon.  You may need to monitor your salt (sodium) intake, especially if you have high blood pressure. Talk with your health care provider or dietitian to get more information about reducing sodium. WHAT FOODS CAN I EAT? Grains Breads, including [place], white, pita, wheat,  raisin, rye, oatmeal, and [place]. Tortillas that are neither fried nor made with lard or trans fat. Low-fat rolls, including hotdog and hamburger buns and English muffins. Biscuits. Muffins. Waffles. Pancakes. Light popcorn. Whole-grain cereals. Flatbread. Melba toast. Pretzels. Breadsticks. Rusks. Low-fat snacks and crackers, including oyster, saltine, matzo,  graham, animal, and rye. Rice and pasta, including brown rice and those that are made with whole wheat. Vegetables All vegetables. Fruits All fruits, but limit coconut. Meats and Other Protein Sources Lean, well-trimmed beef, veal, pork, and lamb. Chicken and [place]key without skin. All fish and shellfish. Wild duck, rabbit, pheasant, and venison. Egg whites or low-cholesterol egg substitutes. Dried beans, peas, lentils, and tofu.Seeds and most nuts. Dairy Low-fat or nonfat cheeses, including ricotta, string, and mozzarella. Skim or 1% milk that is liquid, powdered, or evaporated. Buttermilk that is made with low-fat milk. Nonfat or low-fat yogurt. Beverages Mineral water. Diet carbonated beverages. Sweets and Desserts Sherbets and fruit ices. Honey, jam, marmalade, jelly, and syrups. Meringues and gelatins. Pure sugar candy, such as hard candy, jelly beans, gumdrops, mints, marshmallows, and small amounts of dark chocolate. [employer] food cake. Eat all sweets and desserts in moderation. Fats and Oils Nonhydrogenated (trans-free) margarines. Vegetable oils, including soybean, sesame, sunflower, olive, peanut, safflower, corn, canola, and cottonseed. Salad dressings or mayonnaise that are made with a vegetable oil. Limit added fats and oils that you use for cooking, baking, salads, and as spreads. Other Cocoa powder. Coffee and tea. All seasonings and condiments. The items listed above may not be a complete list of recommended foods or beverages. Contact your dietitian for more options. WHAT FOODS ARE NOT RECOMMENDED? Grains Breads that are made with saturated or trans fats, oils, or whole milk. Croissants. Butter rolls. Cheese breads. Sweet rolls. Donuts. Buttered popcorn. Chow mein noodles. High-fat crackers, such as cheese or butter crackers. Meats and Other Protein Sources Fatty meats, such as hotdogs, short ribs, sausage, spareribs, bacon, ribeye roast or steak, and mutton. High-fat deli meats,  such as salami and bologna. Caviar. Domestic duck and goose. Organ meats, such as kidney, liver, sweetbreads, brains, gizzard, chitterlings, and heart. Dairy Cream, sour cream, cream cheese, and creamed cottage cheese. Whole milk cheeses, including blue (bleu), [address], [place], [place], [address], [place], [address], [place]dar, [place]embert, and [place]. Whole or 2% milk that is liquid, evaporated, or condensed. Whole buttermilk. Cream sauce or high-fat cheese sauce. Yogurt that is made from whole milk. Beverages Regular sodas and drinks with added sugar. Sweets and Desserts Frosting. Pudding. Cookies. Cakes other than angel food cake. Candy that has milk chocolate or white chocolate, hydrogenated fat, butter, coconut, or unknown ingredients. Buttered syrups. Full-fat ice cream or ice cream drinks. Fats and Oils Gravy that has suet, meat fat, or shortening. Cocoa butter, hydrogenated oils, palm oil, coconut oil, palm kernel oil. These can often be found in baked products, candy, fried foods, nondairy creamers, and whipped toppings. Solid fats and shortenings, including bacon fat, salt pork, lard, and butter. Nondairy cream substitutes, such as coffee creamers and sour cream substitutes. Salad dressings that are made of unknown oils, cheese, or sour cream. The items listed above may not be a complete list of foods and beverages to avoid. Contact your dietitian for more information.   This information is not intended to replace advice given to you by your health care provider. Make sure you discuss any questions you have with your health care provider.   Document Released: 02/15/2008 Document Revised: 05/29/2014 Document Reviewed: 10/30/2013 Elsevier Interactive Patient Education  2016 March [employer]B.

## 2015-10-13 LAB — TSH: TSH: 0.78 mIU/L (ref 0.40–4.50)

## 2015-12-06 ENCOUNTER — Ambulatory Visit: Payer: Self-pay | Attending: Internal Medicine

## 2015-12-21 ENCOUNTER — Telehealth: Payer: Self-pay | Admitting: Internal Medicine

## 2015-12-21 DIAGNOSIS — S76319A Strain of muscle, fascia and tendon of the posterior muscle group at thigh level, unspecified thigh, initial encounter: Secondary | ICD-10-CM

## 2015-12-21 NOTE — Telephone Encounter (Signed)
Pt. Called requesting to be referred out to a chiropractic. Pt. Is also requesting  For his nurse to mail him his lab results. Please f/u with pt.

## 2015-12-22 NOTE — Telephone Encounter (Signed)
referral placed. May take 1-2 wks to hear about appt

## 2015-12-22 NOTE — Telephone Encounter (Signed)
Contacted pt to see why he is needing the referral. Pt states he is needing the referral for his ham string.

## 2015-12-22 NOTE — Telephone Encounter (Signed)
Contacted pt and informed him that it will take 1-2 weeks to hear about referral also will be mailing lab results. Pt states he doesn't have any questions or concerns

## 2015-12-22 NOTE — Telephone Encounter (Signed)
Chiropractic referral for what specifically? thx

## 2015-12-22 NOTE — Telephone Encounter (Signed)
Contacted pt to see why he is needing the referral. Pt states he is needing the referral for his ham string.  

## 2016-02-17 ENCOUNTER — Ambulatory Visit: Payer: Self-pay | Admitting: Internal Medicine

## 2016-02-28 ENCOUNTER — Ambulatory Visit: Payer: Self-pay | Attending: Internal Medicine | Admitting: Internal Medicine

## 2016-02-28 ENCOUNTER — Encounter: Payer: Self-pay | Admitting: Internal Medicine

## 2016-02-28 VITALS — BP 123/93 | HR 65 | Temp 98.6°F | Resp 16

## 2016-02-28 DIAGNOSIS — Z114 Encounter for screening for human immunodeficiency virus [HIV]: Secondary | ICD-10-CM

## 2016-02-28 DIAGNOSIS — R030 Elevated blood-pressure reading, without diagnosis of hypertension: Secondary | ICD-10-CM

## 2016-02-28 DIAGNOSIS — G44229 Chronic tension-type headache, not intractable: Secondary | ICD-10-CM

## 2016-02-28 DIAGNOSIS — Z23 Encounter for immunization: Secondary | ICD-10-CM

## 2016-02-28 DIAGNOSIS — L700 Acne vulgaris: Secondary | ICD-10-CM

## 2016-02-28 MED ORDER — CLINDAMYCIN PHOSPHATE 1 % EX GEL: Freq: Two times a day (BID) | CUTANEOUS | 0 refills | Status: DC

## 2016-02-28 MED ORDER — AMITRIPTYLINE HCL 25 MG PO TABS: 25.0000 mg | ORAL_TABLET | Freq: Every day | ORAL | 1 refills | Status: DC

## 2016-02-28 MED FILL — AMITRIPTYLINE HCL 25 MG TAB: 25 | 30 days supply | Qty: 30 | Fill #0

## 2016-02-28 MED FILL — CLINDAMYCIN PH 1% GEL: 1 | 30 days supply | Qty: 30 | Fill #0

## 2016-02-28 NOTE — Progress Notes (Signed)
Shawn Werner, is a 25 y.o. male  WJX:[id number]  [id number]:[id number]  DOB - 1990/10/30  Chief Complaint  Patient presents with  . Hypertension        Subjective:   Mattthew Werner is a 25 y.o. male here today for a follow up visit.  Co of acne, face/back getting worse lately. Also asked for something to help w/ his chronic headaches. Occurs on left frontal regions, typically tylenol helps relieves. Denies stressors, no photophobia/n/v. Says has been having it since he was about 25 yo, unchanged frequency/quality.  Also never was called about dental referral, done in May.  Does eat fast foods frequently, does not add extra salt to food.  Patient has  No chest pain, No abdominal pain - No Nausea, No new weakness tingling or numbness, No Cough - SOB.  No problems updated.  ALLERGIES: No Known Allergies  PAST MEDICAL HISTORY: No past medical history on file.  MEDICATIONS AT HOME: Prior to Admission medications   Medication Sig Start Date End Date Taking? Authorizing Provider  acetaminophen (TYLENOL) 325 MG tablet Take 650 mg by mouth every 6 (six) hours as needed for headache (pain).    Historical Provider, MD  amitriptyline (ELAVIL) 25 MG tablet Take 1 tablet (25 mg total) by mouth at bedtime. 02/28/16   Pete Glatter, MD  clindamycin (CLINDAGEL) 1 % gel Apply topically 2 (two) times daily. 02/28/16   Pete Glatter, MD  cyclobenzaprine (FLEXERIL) 5 MG tablet Take 1 tablet (5 mg total) by mouth 3 (three) times daily as needed for muscle spasms. Patient not taking: Reported on 02/28/2016 08/24/15   Pete Glatter, MD  ibuprofen (ADVIL,MOTRIN) 800 MG tablet Take 1 tablet (800 mg total) by mouth 3 (three) times daily. Patient not taking: Reported on 02/28/2016 07/28/15   Danelle Berry, PA-C  oxyCODONE-acetaminophen (PERCOCET) 5-325 MG tablet Take 1 tablet by mouth every 4 (four) hours as needed. Patient not taking: Reported on 02/28/2016 07/28/15   Danelle Berry, PA-C      Objective:   Vitals:   02/28/16 1606  BP: (!) 123/93  Pulse: 65  Resp: 16  Temp: 98.6 F (37 C)  TempSrc: Oral  SpO2: 97%    Exam General appearance : Awake, alert, not in any distress. Speech Clear. Not toxic looking HEENT: Atraumatic and Normocephalic, pupils equally reactive to light. bilat tms clear, small amount of ceruminosis on right ear.  Frontal and maxillary sinus nttp Neck: supple, no JVD.  Chest:Good air entry bilaterally, no added sounds. CVS: S1 S2 regular, no murmurs/gallups or rubs. Abdomen: Bowel sounds active, Non tender and not distended  Extremities: B/L Lower Ext shows no edema, both legs are warm to touch Neurology: Awake alert, and oriented X 3, CN II-XII grossly intact, Non focal Skin:No Rash  Data Review No results found for: HGBA1C  Depression screen [facility] 2/9 02/28/2016 10/12/2015 08/19/2015  Decreased Interest 0 0 0  Down, Depressed, Hopeless 0 0 0  PHQ - 2 Score 0 0 0  Altered sleeping - 0 -  Tired, decreased energy - 0 -  Change in appetite - 0 -  Feeling bad or failure about yourself  - 0 -  Trouble concentrating - 0 -  Moving slowly or fidgety/restless - 0 -  Suicidal thoughts - 0 -  PHQ-9 Score - 0 -  Difficult doing work/chores - Not difficult at all -      Assessment & Plan   1. Elevated blood pressure reading Recd lifestyle modification,  dash diet., increase exercise Come back in few months, rechk, may need rx  2. Encounter for screening for HIV - HIV antibody (with reflex)  3. Acne vulgaris Trial clindamycin gel, recd bleach bath once as well.  4. Chronic tension-type headache, not intractable Unchanged in quality, quantity., so less concerning Trial amitriptyline 25 qhs.  5. Encounter for immunization - Flu Vaccine QUAD 36+ mos IM tdap today as well    Patient have been counseled extensively about nutrition and exercise  Return in about 3 months (around 05/30/2016) for high bp.  The patient was given clear  instructions to go to ER or return to medical center if symptoms don't improve, worsen or new problems develop. The patient verbalized understanding. The patient was told to call to get lab results if they haven't heard anything in the next week.   This note has been created with Education officer, environmentalDragon speech recognition software and smart phrase technology. Any transcriptional errors are unintentional.   Pete Glatterawn T Kamarie Palma, MD, MBA/MHA [facility] [place], [place] [phone]   02/28/2016, 4:54 PM

## 2016-02-28 NOTE — Progress Notes (Signed)
Pt is in the office for bp check Pt states he is not in any pain

## 2016-02-28 NOTE — Patient Instructions (Addendum)
Cholesterol teaching; To address this please limit saturated fat to no more than 7% of your calories, limit cholesterol to 200 mg/day, increase fiber and exercise as tolerated.  - Bleach bath;  Diluted bleach bath recipe and instructions Add  -  cup of common 5% household bleach to a bathtub full of water (40 gallons). Soak your torso or just the affected part of your skin for about 10 minutes. Limit diluted bleach baths to no more than twice a week. Do not submerge your head and be very careful to avoid getting the diluted bleach into the eyes. Rinse off with fresh water and apply moisturizer.   Cholesterol Cholesterol is a fat. Your body needs a small amount of cholesterol. Cholesterol may build up in your blood vessels. This increases your chance of having a heart attack or stroke. You cannot feel your cholesterol levels. The only way to know your cholesterol level is high is with a blood test. Keep your test results. Work with your doctor to keep your cholesterol at a good level. WHAT DO THE TEST RESULTS MEAN?  Total cholesterol is how much cholesterol is in your blood.  LDL is bad cholesterol. This is the type that can build up. You want LDL to be low.  HDL is good cholesterol. It cleans your blood vessels and carries LDL away. You want HDL to be high.  Triglycerides are fat that the body can burn for energy or store. WHAT ARE GOOD LEVELS OF CHOLESTEROL?  Total cholesterol below 200.  LDL below 100 for people at risk. Below 70 for those at very high risk.  HDL above 50 is good. Above 60 is best.  Triglycerides below 150. HOW CAN I LOWER MY CHOLESTEROL?  Diet. Follow your diet programs as told by your doctor.  Choose fish, white meat chicken, roasted [place], or baked [place]. Try not to eat red meat, fried foods, or processed meats such as sausage and lunch meats.  Eat lots of fresh fruits and vegetables.  Choose whole grains, beans, pasta, potatoes, and cereals.  Use  only small amounts of olive, corn, or canola oils.  Try not to eat butter, mayonnaise, shortening, or palm kernel oils.  Try not to eat foods with trans fats.  Drink skim or nonfat milk. Eat low-fat or nonfat yogurt and cheeses. Try not to drink whole milk or cream. Try not to eat ice cream, egg yolks, and full-fat cheeses.  Healthy desserts include angel food cake, ginger snaps, animal crackers, hard candy, popsicles, and low-fat or nonfat frozen yogurt. Try not to eat pastries, cakes, pies, and cookies.  Exercise. Follow your exercise programs as told by your doctor.  Be more active. You can try gardening, walking, or taking the stairs. Ask your doctor about how you can be more active.  Medicine. Take medicine as told by your doctor.   This information is not intended to replace advice given to you by your health care provider. Make sure you discuss any questions you have with your health care provider.   Document Released: 08/04/2008 Document Revised: 05/29/2014 Document Reviewed: 02/19/2013 Elsevier Interactive Patient Education 2016 Elsevier Inc.   -  Hypertension Hypertension is another name for high blood pressure. High blood pressure forces your heart to work harder to pump blood. A blood pressure reading has two numbers, which includes a higher number over a lower number (example: 110/72). HOME CARE   Have your blood pressure rechecked by your doctor.  Only take medicine as told by  your doctor. Follow the directions carefully. The medicine does not work as well if you skip doses. Skipping doses also puts you at risk for problems.  Do not smoke.  Monitor your blood pressure at home as told by your doctor. GET HELP IF:  You think you are having a reaction to the medicine you are taking.  You have repeat headaches or feel dizzy.  You have puffiness (swelling) in your ankles.  You have trouble with your vision. GET HELP RIGHT AWAY IF:   You get a very bad headache  and are confused.  You feel weak, numb, or faint.  You get chest or belly (abdominal) pain.  You throw up (vomit).  You cannot breathe very well. MAKE SURE YOU:   Understand these instructions.  Will watch your condition.  Will get help right away if you are not doing well or get worse.   This information is not intended to replace advice given to you by your health care provider. Make sure you discuss any questions you have with your health care provider.   Document Released: 10/25/2007 Document Revised: 05/13/2013 Document Reviewed: 02/28/2013 Elsevier Interactive Patient Education 2016 Elsevier Inc.   - DASH Eating Plan DASH stands for "Dietary Approaches to Stop Hypertension." The DASH eating plan is a healthy eating plan that has been shown to reduce high blood pressure (hypertension). Additional health benefits may include reducing the risk of type 2 diabetes mellitus, heart disease, and stroke. The DASH eating plan may also help with weight loss. WHAT DO I NEED TO KNOW ABOUT THE DASH EATING PLAN? For the DASH eating plan, you will follow these general guidelines:  Choose foods with a percent daily value for sodium of less than 5% (as listed on the food label).  Use salt-free seasonings or herbs instead of table salt or sea salt.  Check with your health care provider or pharmacist before using salt substitutes.  Eat lower-sodium products, often labeled as "lower sodium" or "no salt added."  Eat fresh foods.  Eat more vegetables, fruits, and low-fat dairy products.  Choose whole grains. Look for the word "whole" as the first word in the ingredient list.  Choose fish and skinless chicken or [place] more often than red meat. Limit fish, poultry, and meat to 6 oz (170 g) each day.  Limit sweets, desserts, sugars, and sugary drinks.  Choose heart-healthy fats.  Limit cheese to 1 oz (28 g) per day.  Eat more home-cooked food and less restaurant, buffet, and fast  food.  Limit fried foods.  Cook foods using methods other than frying.  Limit canned vegetables. If you do use them, rinse them well to decrease the sodium.  When eating at a restaurant, ask that your food be prepared with less salt, or no salt if possible. WHAT FOODS CAN I EAT? Seek help from a dietitian for individual calorie needs. Grains Whole grain or whole wheat bread. Brown rice. Whole grain or whole wheat pasta. Quinoa, bulgur, and whole grain cereals. Low-sodium cereals. Corn or whole wheat flour tortillas. Whole grain cornbread. Whole grain crackers. Low-sodium crackers. Vegetables Fresh or frozen vegetables (raw, steamed, roasted, or grilled). Low-sodium or reduced-sodium tomato and vegetable juices. Low-sodium or reduced-sodium tomato sauce and paste. Low-sodium or reduced-sodium canned vegetables.  Fruits All fresh, canned (in natural juice), or frozen fruits. Meat and Other Protein Products Ground beef (85% or leaner), grass-fed beef, or beef trimmed of fat. Skinless chicken or [place]. Ground chicken or [place]. Pork trimmed of  fat. All fish and seafood. Eggs. Dried beans, peas, or lentils. Unsalted nuts and seeds. Unsalted canned beans. Dairy Low-fat dairy products, such as skim or 1% milk, 2% or reduced-fat cheeses, low-fat ricotta or cottage cheese, or plain low-fat yogurt. Low-sodium or reduced-sodium cheeses. Fats and Oils Tub margarines without trans fats. Light or reduced-fat mayonnaise and salad dressings (reduced sodium). Avocado. Safflower, olive, or canola oils. Natural peanut or almond butter. Other Unsalted popcorn and pretzels. The items listed above may not be a complete list of recommended foods or beverages. Contact your dietitian for more options. WHAT FOODS ARE NOT RECOMMENDED? Grains White bread. White pasta. White rice. Refined cornbread. Bagels and croissants. Crackers that contain trans fat. Vegetables Creamed or fried vegetables. Vegetables in a  cheese sauce. Regular canned vegetables. Regular canned tomato sauce and paste. Regular tomato and vegetable juices. Fruits Dried fruits. Canned fruit in light or heavy syrup. Fruit juice. Meat and Other Protein Products Fatty cuts of meat. Ribs, chicken wings, bacon, sausage, bologna, salami, chitterlings, fatback, hot dogs, bratwurst, and packaged luncheon meats. Salted nuts and seeds. Canned beans with salt. Dairy Whole or 2% milk, cream, half-and-half, and cream cheese. Whole-fat or sweetened yogurt. Full-fat cheeses or blue cheese. Nondairy creamers and whipped toppings. Processed cheese, cheese spreads, or cheese curds. Condiments Onion and garlic salt, seasoned salt, table salt, and sea salt. Canned and packaged gravies. Worcestershire sauce. Tartar sauce. Barbecue sauce. Teriyaki sauce. Soy sauce, including reduced sodium. Steak sauce. Fish sauce. Oyster sauce. Cocktail sauce. Horseradish. Ketchup and mustard. Meat flavorings and tenderizers. Bouillon cubes. Hot sauce. Tabasco sauce. Marinades. Taco seasonings. Relishes. Fats and Oils Butter, stick margarine, lard, shortening, ghee, and bacon fat. Coconut, palm kernel, or palm oils. Regular salad dressings. Other Pickles and olives. Salted popcorn and pretzels. The items listed above may not be a complete list of foods and beverages to avoid. Contact your dietitian for more information. WHERE CAN I FIND MORE INFORMATION? National Heart, Lung, and Blood Institute: CablePromo.it   This information is not intended to replace advice given to you by your health care provider. Make sure you discuss any questions you have with your health care provider.   Document Released: 04/27/2011 Document Revised: 05/29/2014 Document Reviewed: 03/12/2013 Elsevier Interactive Patient Education 2016 Elsevier Inc.   - Cluster Headache Cluster headaches are recognized by their pattern of deep, intense head pain. They  normally occur on one side of your head, but they may "switch sides" in subsequent episodes. Typically, cluster headaches:   Are severe in nature.   Occur repeatedly over weeks to months and are followed by periods of no headaches.   Can last from 15 minutes to 3 hours.   Occur at the same time each day, often at night.   Occur several times a day. CAUSES The exact cause of cluster headaches is not known. Alcohol use may be associated with cluster headaches. SIGNS AND SYMPTOMS   Severe pain that begins in or around your eye or temple.   One-sided head pain.   Feeling sick to your stomach (nauseous).   Sensitivity to light.   Runny nose.   Eye redness, tearing, and nasal stuffiness on the side of your head where you are experiencing pain.   Sweaty, pale skin of the face.   Droopy or swollen eyelid.   Restlessness. DIAGNOSIS  Cluster headaches are diagnosed based on symptoms and a physical exam. Your health care provider may order a CT scan or an MRI of your head or  lab tests to see if your headaches are caused by other medical conditions.  TREATMENT   Medicines for pain relief and to prevent recurrent attacks. Some people may need a combination of medicines.  Oxygen for pain relief.   Biofeedback programs to help reduce headache pain.  It may be helpful to keep a headache diary. This may help you find a trend for what is triggering your headaches. Your health care provider can develop a treatment plan.  HOME CARE INSTRUCTIONS  During cluster periods:   Follow a regular sleep schedule. Do not vary the amount and time that you sleep from day to day. It is important to stay on the same schedule during a cluster period to help prevent headaches.   Avoid alcohol.   Stop smoking if you smoke.  SEEK MEDICAL CARE IF:  You have any changes from your previous cluster headaches either in intensity or frequency.   You are not getting relief from medicines  you are taking.  SEEK IMMEDIATE MEDICAL CARE IF:   You faint.   You have weakness or numbness, especially on one side of your body or face.   You have double vision.   You have nausea or vomiting that is not relieved within several hours.   You cannot keep your balance or have difficulty talking or walking.   You have neck pain or stiffness.   You have a fever. MAKE SURE YOU:  Understand these instructions.   Will watch your condition.   Will get help right away if you are not doing well or get worse.   This information is not intended to replace advice given to you by your health care provider. Make sure you discuss any questions you have with your health care provider.   Document Released: 05/08/2005 Document Revised: 02/26/2013 Document Reviewed: 11/28/2012 Elsevier Interactive Patient Education 2016 [employer]. Tdap Vaccine (Tetanus, Diphtheria and Pertussis): What You Need to Know 1. Why get vaccinated? Tetanus, diphtheria and pertussis are very serious diseases. Tdap vaccine can protect [place] from these diseases. And, Tdap vaccine given to pregnant women can protect newborn babies against pertussis. TETANUS (Lockjaw) is rare in the [place] today. It causes painful muscle tightening and stiffness, usually all over the body.  It can lead to tightening of muscles in the head and neck so you can't open your mouth, swallow, or sometimes even breathe. Tetanus kills about 1 out of 10 people who are infected even after receiving the best medical care. DIPHTHERIA is also rare in the [place] today. It can cause a thick coating to form in the back of the throat.  It can lead to breathing problems, heart failure, paralysis, and death. PERTUSSIS (Whooping Cough) causes severe coughing spells, which can cause difficulty breathing, vomiting and disturbed sleep.  It can also lead to weight loss, incontinence, and rib fractures. Up to 2 in 100 adolescents and 5 in 100  adults with pertussis are hospitalized or have complications, which could include pneumonia or death. These diseases are caused by bacteria. Diphtheria and pertussis are spread from person to person through secretions from coughing or sneezing. Tetanus enters the body through cuts, scratches, or wounds. Before vaccines, as many as 200,000 cases of diphtheria, 200,000 cases of pertussis, and hundreds of cases of tetanus, were reported in the [place] each year. Since vaccination began, reports of cases for tetanus and diphtheria have dropped by about 99% and for pertussis by about 80%. 2. Tdap vaccine Tdap vaccine can  protect adolescents and adults from tetanus, diphtheria, and pertussis. One dose of Tdap is routinely given at age 26 or 1112. People who did not get Tdap at that age should get it as soon as possible. Tdap is especially important for healthcare professionals and anyone having close contact with a baby younger than 12 months. Pregnant women should get a dose of Tdap during every pregnancy, to protect the newborn from pertussis. Infants are most at risk for severe, life-threatening complications from pertussis. Another vaccine, called Td, protects against tetanus and diphtheria, but not pertussis. A Td booster should be given every 10 years. Tdap may be given as one of these boosters if you have never gotten Tdap before. Tdap may also be given after a severe cut or burn to prevent tetanus infection. Your doctor or the person giving you the vaccine can give you more information. Tdap may safely be given at the same time as other vaccines. 3. Some people should not get this vaccine  A person who has ever had a life-threatening allergic reaction after a previous dose of any diphtheria, tetanus or pertussis containing vaccine, OR has a severe allergy to any part of this vaccine, should not get Tdap vaccine. Tell the person giving the vaccine about any severe allergies.  Anyone who had coma  or long repeated seizures within 7 days after a childhood dose of DTP or DTaP, or a previous dose of Tdap, should not get Tdap, unless a cause other than the vaccine was found. They can still get Td.  Talk to your doctor if you:  have seizures or another nervous system problem,  had severe pain or swelling after any vaccine containing diphtheria, tetanus or pertussis,  ever had a condition called Guillain-Barr Syndrome (GBS),  aren't feeling well on the day the shot is scheduled. 4. Risks With any medicine, including vaccines, there is a chance of side effects. These are usually mild and go away on their own. Serious reactions are also possible but are rare. Most people who get Tdap vaccine do not have any problems with it. Mild problems following Tdap (Did not interfere with activities)  Pain where the shot was given (about 3 in 4 adolescents or 2 in 3 adults)  Redness or swelling where the shot was given (about 1 person in 5)  Mild fever of at least 100.47F (up to about 1 in 25 adolescents or 1 in 100 adults)  Headache (about 3 or 4 people in 10)  Tiredness (about 1 person in 3 or 4)  Nausea, vomiting, diarrhea, stomach ache (up to 1 in 4 adolescents or 1 in 10 adults)  Chills, sore joints (about 1 person in 10)  Body aches (about 1 person in 3 or 4)  Rash, swollen glands (uncommon) Moderate problems following Tdap (Interfered with activities, but did not require medical attention)  Pain where the shot was given (up to 1 in 5 or 6)  Redness or swelling where the shot was given (up to about 1 in 16 adolescents or 1 in 12 adults)  Fever over 102F (about 1 in 100 adolescents or 1 in 250 adults)  Headache (about 1 in 7 adolescents or 1 in 10 adults)  Nausea, vomiting, diarrhea, stomach ache (up to 1 or 3 people in 100)  Swelling of the entire arm where the shot was given (up to about 1 in 500). Severe problems following Tdap (Unable to perform usual activities;  required medical attention)  Swelling, severe pain, bleeding and  redness in the arm where the shot was given (rare). Problems that could happen after any vaccine:  People sometimes faint after a medical procedure, including vaccination. Sitting or lying down for about 15 minutes can help prevent fainting, and injuries caused by a fall. Tell your doctor if you feel dizzy, or have vision changes or ringing in the ears.  Some people get severe pain in the shoulder and have difficulty moving the arm where a shot was given. This happens very rarely.  Any medication can cause a severe allergic reaction. Such reactions from a vaccine are very rare, estimated at fewer than 1 in a million doses, and would happen within a few minutes to a few hours after the vaccination. As with any medicine, there is a very remote chance of a vaccine causing a serious injury or death. The safety of vaccines is always being monitored. For more information, visit: http://floyd.org/ 5. What if there is a serious problem? What should I look for?  Look for anything that concerns you, such as signs of a severe allergic reaction, very high fever, or unusual behavior.  Signs of a severe allergic reaction can include hives, swelling of the face and throat, difficulty breathing, a fast heartbeat, dizziness, and weakness. These would usually start a few minutes to a few hours after the vaccination. What should I do?  If you think it is a severe allergic reaction or other emergency that can't wait, call 9-1-1 or get the person to the nearest hospital. Otherwise, call your doctor.  Afterward, the reaction should be reported to the Vaccine Adverse Event Reporting System (VAERS). Your doctor might file this report, or you can do it yourself through the VAERS web site at www.vaers.LAgents.no, or by calling 1-629-459-6560. VAERS does not give medical advice.  6. The National Vaccine Injury Compensation Program The [employer]  Vaccine Injury Compensation Program (VICP) is a federal program that was created to compensate people who may have been injured by certain vaccines. Persons who believe they may have been injured by a vaccine can learn about the program and about filing a claim by calling 1-763-728-7481 or visiting the VICP website at SpiritualWord.at. There is a time limit to file a claim for compensation. 7. How can I learn more?  Ask your doctor. He or she can give you the vaccine package insert or suggest other sources of information.  Call your local or state health department.  Contact the Centers for Disease Control and Prevention (CDC):  Call 6314856086 (1-800-CDC-INFO) or  Visit CDC's website at PicCapture.uy CDC Tdap Vaccine VIS (07/15/13)   This information is not intended to replace advice given to you by your health care provider. Make sure you discuss any questions you have with your health care provider.   Document Released: 11/07/2011 Document Revised: 05/29/2014 Document Reviewed: 08/20/2013 Elsevier Interactive Patient Education 2016 Elsevier Inc. Influenza Virus Vaccine injection (Fluarix) What is this medicine? INFLUENZA VIRUS VACCINE (in floo EN zuh VAHY ruhs vak SEEN) helps to reduce the risk of getting influenza also known as the flu. This medicine may be used for other purposes; ask your health care provider or pharmacist if you have questions. What should I tell my health care provider before I take this medicine? They need to know if you have any of these conditions: -bleeding disorder like hemophilia -fever or infection -Guillain-Barre syndrome or other neurological problems -immune system problems -infection with the human immunodeficiency virus (HIV) or AIDS -low blood platelet counts -multiple sclerosis -an  unusual or allergic reaction to influenza virus vaccine, eggs, chicken proteins, latex, gentamicin, other medicines, foods, dyes or  preservatives -pregnant or trying to get pregnant -breast-feeding How should I use this medicine? This vaccine is for injection into a muscle. It is given by a health care professional. A copy of Vaccine Information Statements will be given before each vaccination. Read this sheet carefully each time. The sheet may change frequently. Talk to your pediatrician regarding the use of this medicine in children. Special care may be needed. Overdosage: If you think you have taken too much of this medicine contact a poison control center or emergency room at once. NOTE: This medicine is only for you. Do not share this medicine with others. What if I miss a dose? This does not apply. What may interact with this medicine? -chemotherapy or radiation therapy -medicines that lower your immune system like etanercept, anakinra, infliximab, and adalimumab -medicines that treat or prevent blood clots like warfarin -phenytoin -steroid medicines like prednisone or cortisone -theophylline -vaccines This list may not describe all possible interactions. Give your health care provider a list of all the medicines, herbs, non-prescription drugs, or dietary supplements you use. Also tell them if you smoke, drink alcohol, or use illegal drugs. Some items may interact with your medicine. What should I watch for while using this medicine? Report any side effects that do not go away within 3 days to your doctor or health care professional. Call your health care provider if any unusual symptoms occur within 6 weeks of receiving this vaccine. You may still catch the flu, but the illness is not usually as bad. You cannot get the flu from the vaccine. The vaccine will not protect against colds or other illnesses that may cause fever. The vaccine is needed every year. What side effects may I notice from receiving this medicine? Side effects that you should report to your doctor or health care professional as soon as  possible: -allergic reactions like skin rash, itching or hives, swelling of the face, lips, or tongue Side effects that usually do not require medical attention (report to your doctor or health care professional if they continue or are bothersome): -fever -headache -muscle aches and pains -pain, tenderness, redness, or swelling at site where injected -weak or tired This list may not describe all possible side effects. Call your doctor for medical advice about side effects. You may report side effects to FDA at 1-800-FDA-1088. Where should I keep my medicine? This vaccine is only given in a clinic, pharmacy, doctor's office, or other health care setting and will not be stored at home. NOTE: This sheet is a summary. It may not cover all possible information. If you have questions about this medicine, talk to your doctor, pharmacist, or health care provider.    2016, Elsevier/Gold Standard. (2007-12-04 09:30:40)

## 2016-02-29 LAB — HIV ANTIBODY (ROUTINE TESTING W REFLEX): HIV: NONREACTIVE

## 2016-03-02 ENCOUNTER — Telehealth: Payer: Self-pay

## 2016-03-02 NOTE — Telephone Encounter (Signed)
Contacted pt to go over lab results pt is aware of results and doesn't have any questions or concerns 

## 2016-04-21 ENCOUNTER — Other Ambulatory Visit: Payer: Self-pay | Admitting: Internal Medicine

## 2016-04-21 MED FILL — AMITRIPTYLINE HCL 25 MG TAB: 25 | 30 days supply | Qty: 30 | Fill #1

## 2016-04-21 MED FILL — CLINDAMYCIN PH 1% GEL: 1 | 15 days supply | Qty: 30 | Fill #0

## 2016-10-05 ENCOUNTER — Encounter: Payer: Self-pay | Admitting: Internal Medicine

## 2016-10-06 ENCOUNTER — Encounter: Payer: Self-pay | Admitting: Internal Medicine

## 2016-10-09 ENCOUNTER — Encounter: Payer: Self-pay | Admitting: Internal Medicine

## 2016-11-14 ENCOUNTER — Ambulatory Visit: Payer: Self-pay | Attending: Internal Medicine | Admitting: *Deleted

## 2016-11-14 DIAGNOSIS — R509 Fever, unspecified: Secondary | ICD-10-CM | POA: Insufficient documentation

## 2016-11-14 DIAGNOSIS — J029 Acute pharyngitis, unspecified: Secondary | ICD-10-CM | POA: Insufficient documentation

## 2016-11-14 NOTE — Progress Notes (Signed)
   Patient Triage Assessment Form   Today's Date:11/14/2016  Name: Shawn Werner  DOB: January 23, 1991 Reason for walkin: sick ( fever) When did your symptoms start: Sunday night  Please list symptoms: fever, chills, headache, tired,  Non- productive cough Are you having pain: no, just sore throat  Assessment Vital Signs:  T: 98.4   P: 69 R: 16  SpO2: 100  BP: 121/81  Pt states he has only take OTC Ibuprofen. Last dose was last night. No swelling in tonsils. Throat is red. Runny nose.  Denies productive cough.   Plan Rapid strep test negative Pt to take OTC sinus medication Tylenol or Advil No appointment available today.  Call office and/or return if symptoms worsen by Friday or Monday for OV.

## 2017-04-27 IMAGING — DX DG HAND COMPLETE 3+V*R*
3 series · 3 of 3 positions shown · non-contrast
Comparison: None

CLINICAL DATA: Fell last the, injury right hand. Pain, swelling and
bruising

EXAM:
RIGHT HAND - COMPLETE 3+ VIEW

[hand pa]
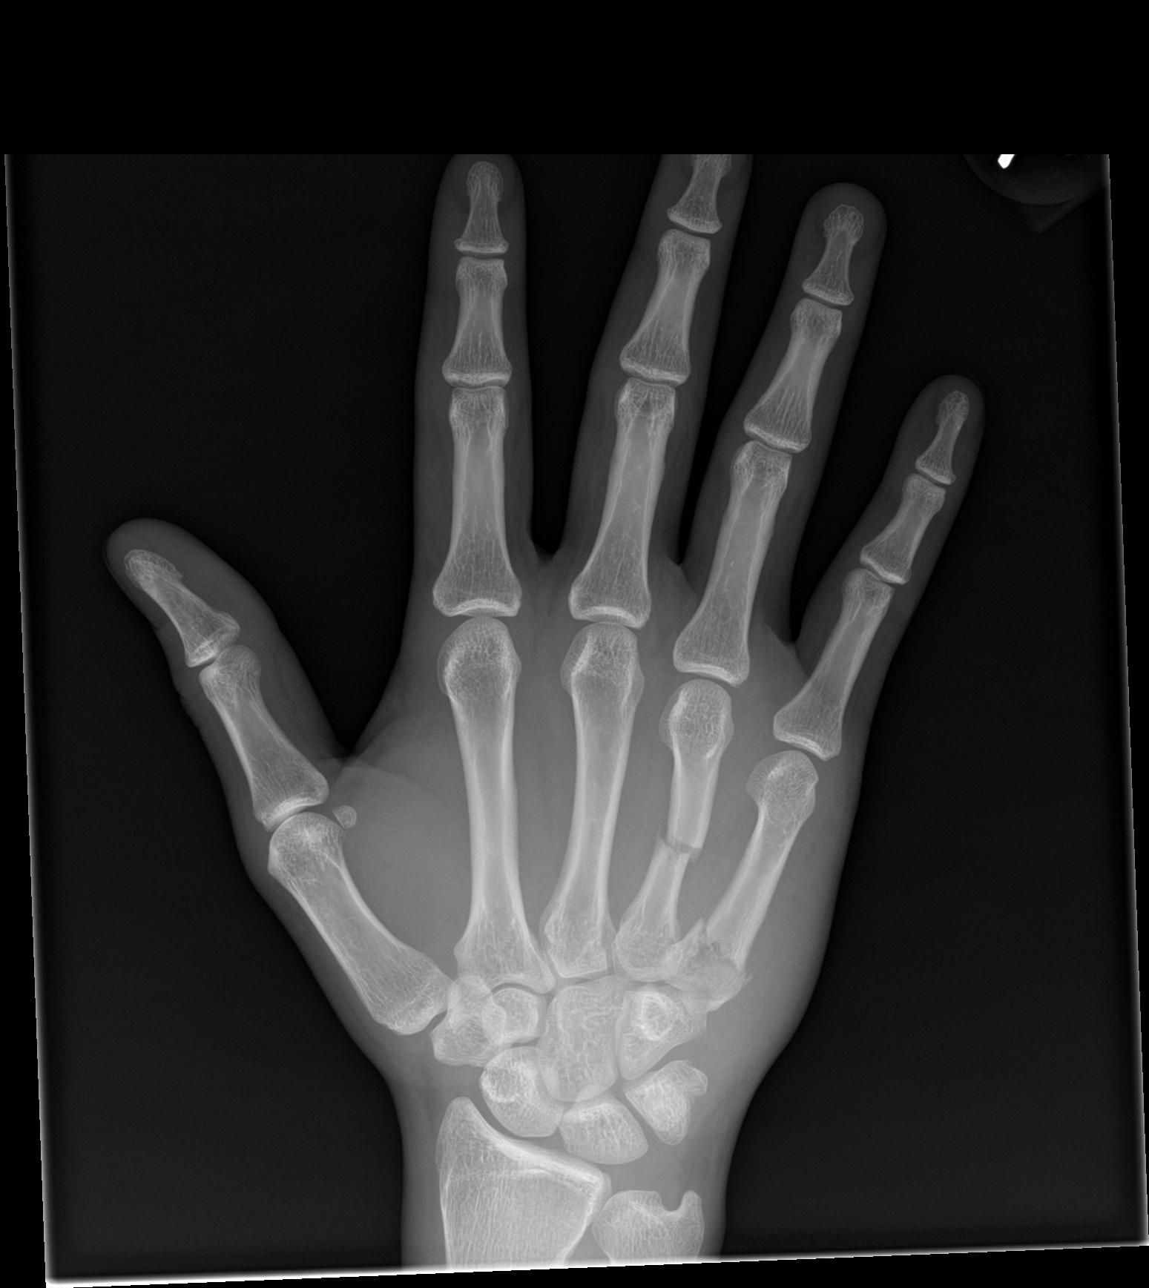

[hand obl]
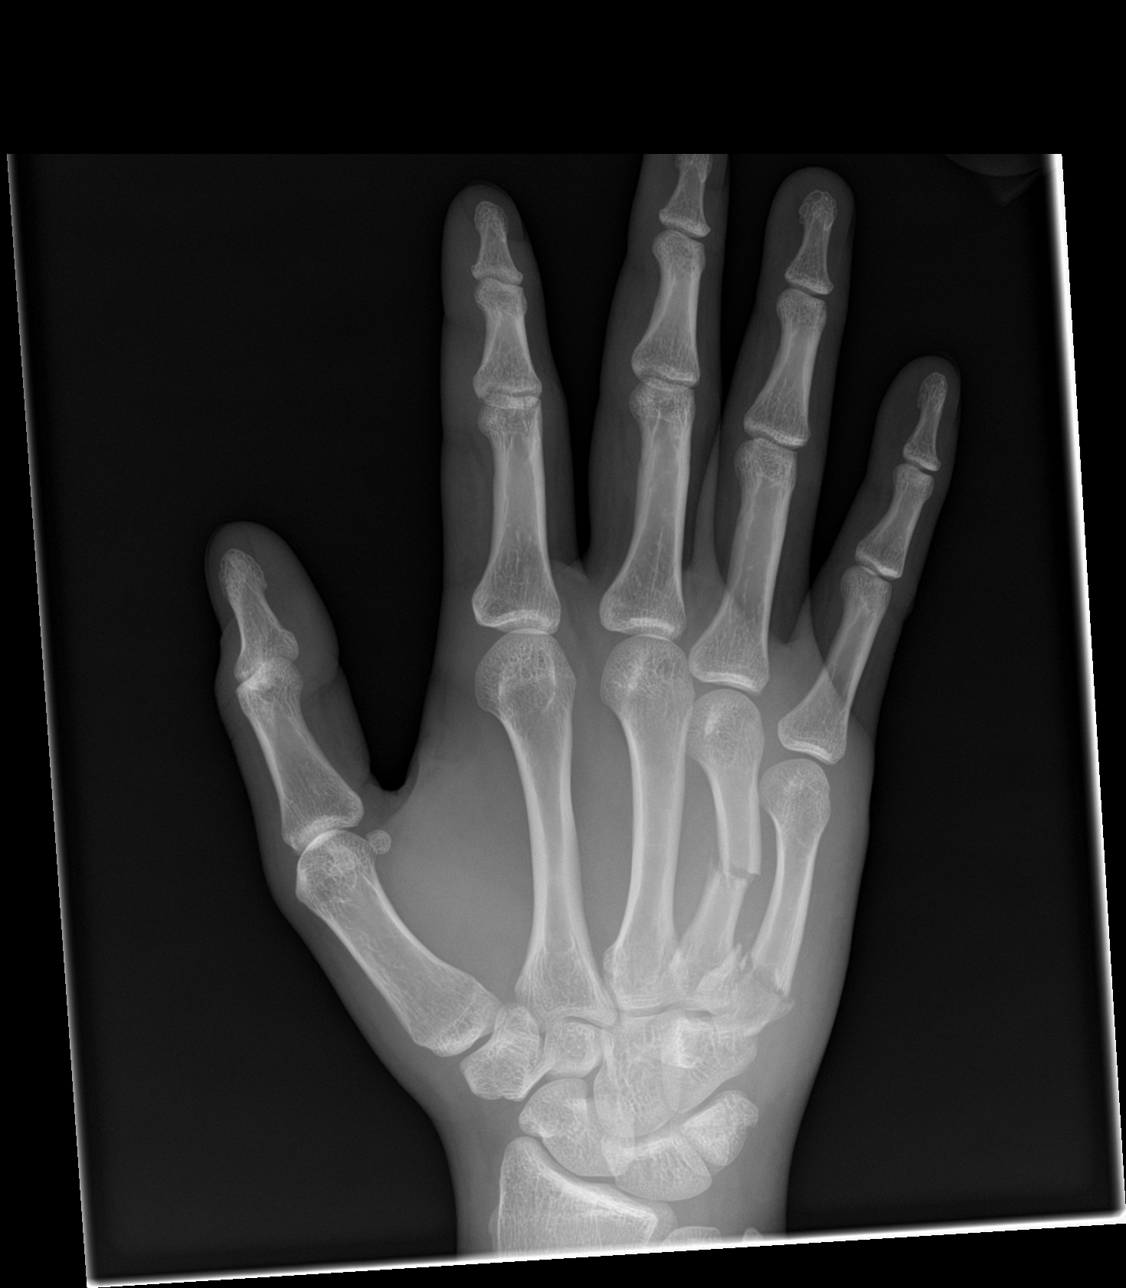

[hand lat]
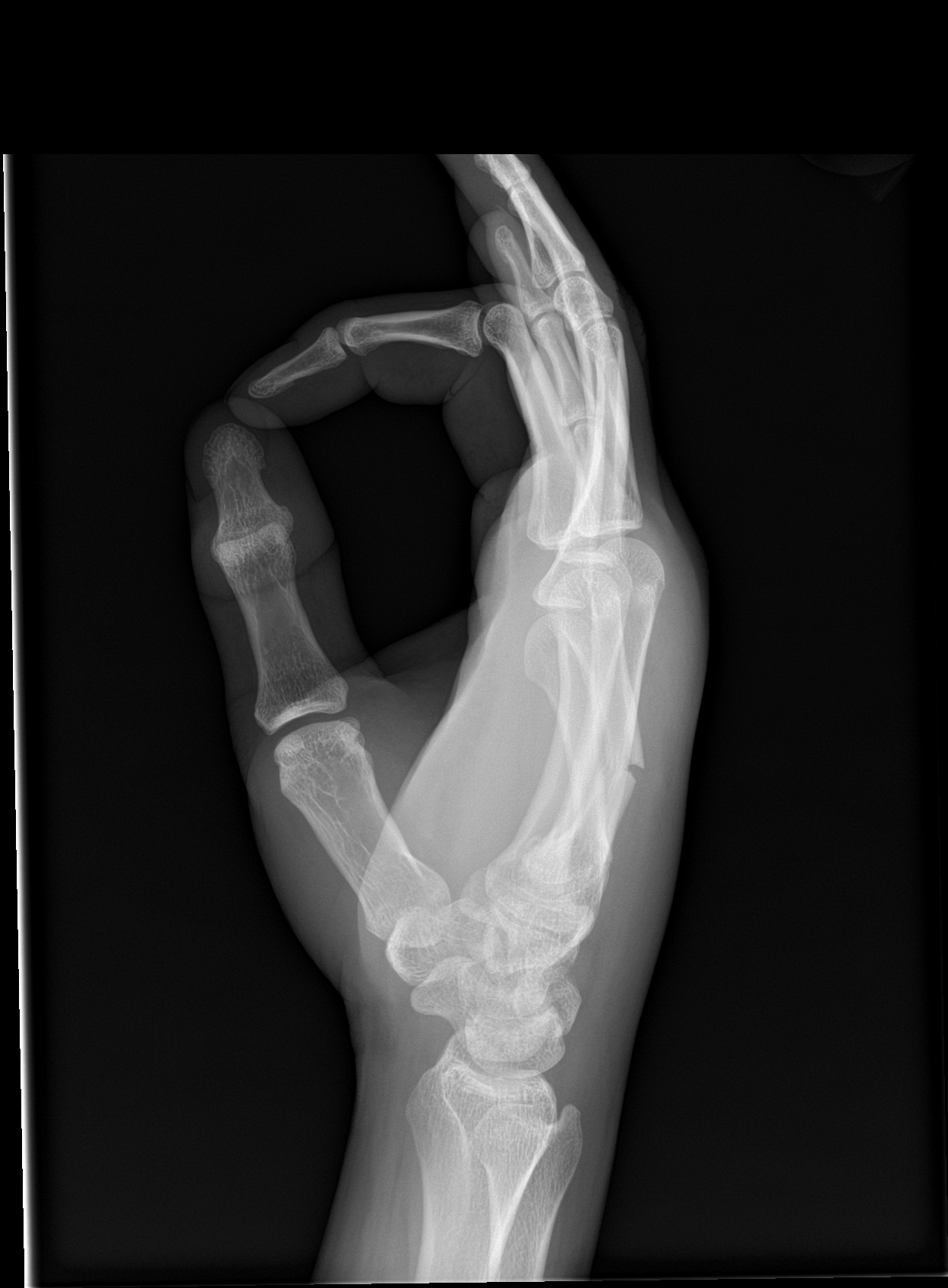

[3 of 3 positions shown; findings below may reference images not displayed]

FINDINGS: Fractures are noted through the midportion of the right fourth
metacarpal and proximal the right fifth metacarpal. Mild
displacement. Overlying soft tissue swelling.
IMPRESSION: Fractures through the fourth and fifth metacarpals.

## 2017-06-18 ENCOUNTER — Ambulatory Visit: Payer: Self-pay

## 2017-06-25 ENCOUNTER — Ambulatory Visit (INDEPENDENT_AMBULATORY_CARE_PROVIDER_SITE_OTHER): Payer: Self-pay | Admitting: Family Medicine

## 2017-06-25 ENCOUNTER — Encounter: Payer: Self-pay | Admitting: Family Medicine

## 2017-06-25 ENCOUNTER — Ambulatory Visit: Payer: Self-pay

## 2017-06-25 VITALS — BP 123/83 | HR 74 | Temp 98.4°F | Resp 16 | Ht 70.0 in | Wt 186.0 lb

## 2017-06-25 DIAGNOSIS — Z13220 Encounter for screening for lipoid disorders: Secondary | ICD-10-CM

## 2017-06-25 DIAGNOSIS — Z131 Encounter for screening for diabetes mellitus: Secondary | ICD-10-CM

## 2017-06-25 DIAGNOSIS — Z23 Encounter for immunization: Secondary | ICD-10-CM

## 2017-06-25 DIAGNOSIS — G44209 Tension-type headache, unspecified, not intractable: Secondary | ICD-10-CM

## 2017-06-25 DIAGNOSIS — Z0000 Encounter for general adult medical examination without abnormal findings: Secondary | ICD-10-CM | POA: Insufficient documentation

## 2017-06-25 MED ORDER — BUTALBITAL-APAP-CAFFEINE 50-325-40 MG PO TABS: 1.0000 | ORAL_TABLET | Freq: Four times a day (QID) | ORAL | 0 refills | Status: DC | PRN

## 2017-06-25 MED FILL — BUTALB-ACETAMIN-CAFF 50-325: 50-325-40 | 7 days supply | Qty: 30 | Fill #0

## 2017-06-25 NOTE — Patient Instructions (Signed)
He had a complete physical exam, we will follow-up by phone with any abnormal laboratory results.  Recommend a low-fat, low carbohydrate diet divided over 5-6 small meals throughout the day.  Also recommend a low impact cardiovascular exercise routine 3-5 days/week 30 minutes/day.  For chronic headache pain we will start a trial of Fioricet every 6 hours as needed for moderate to severe headache pain.  Increase daily hydration to 6-8 glasses/day.    Acetaminophen; Butalbital; Caffeine Oral solution What is this medicine? ACETAMINOPHEN; BUTALBITAL; CAFFEINE (a set a MEE noe fen; byoo TAL bi tal; KAF een) is a pain reliever. It is used to treat tension headaches. This medicine may be used for other purposes; ask your health care provider or pharmacist if you have questions. COMMON BRAND NAME(S): Alagesic LQ, Dolgic LQ, Vanatol LQ, Vanatol S What should I tell my health care provider before I take this medicine? They need to know if you have any of these conditions: -drug abuse or addiction -heart or circulation problems -if you often drink alcohol -kidney disease or problems going to the bathroom -liver disease -lung disease, asthma, or breathing problems -porphyria -an unusual or allergic reaction to parabens, acetaminophen, butalbital or other barbiturates, caffeine, other medicines, foods, dyes, or preservatives -pregnant or trying to get pregnant -breast-feeding How should I use this medicine? Take this medicine by mouth. Use a specially marked spoon or dropper to measure your dose. Ask your pharmacist if you do not have a dropper or measuring spoon. Do not use a household spoon. Follow the directions on the prescription label. If the medicine upsets your stomach, take the medicine with food or milk. Do not take more than you are told to take. Talk to your pediatrician regarding the use of this medicine in children. Special care may be needed. Overdosage: If you think you have taken too  much of this medicine contact a poison control center or emergency room at once. NOTE: This medicine is only for you. Do not share this medicine with others. What if I miss a dose? If you miss a dose, take it as soon as you can. If it is almost time for your next dose, take only that dose. Do not take double or extra doses. What may interact with this medicine? -alcohol or medicines that contain alcohol -antidepressants, especially MAOIs like isocarboxazid, phenelzine, tranylcypromine, and selegiline -antihistamines -benzodiazepines -carbamazepine -isoniazid -medicines for pain like pentazocine, buprenorphine, butorphanol, nalbuphine, tramadol, and propoxyphene -muscle relaxants -phenobarbital, phenytoin, and fosphenytoin -phenothiazines like perphenazine, thioridazine, chlorpromazine, mesoridazine, fluphenazine, prochlorperazine, promazine, and trifluoperazine -voriconazole This list may not describe all possible interactions. Give your health care provider a list of all the medicines, herbs, non-prescription drugs, or dietary supplements you use. Also tell them if you smoke, drink alcohol, or use illegal drugs. Some items may interact with your medicine. What should I watch for while using this medicine? Tell your doctor or health care professional if your pain does not go away, if it gets worse, or if you have new or a different type of pain. You may develop tolerance to the medicine. Tolerance means that you will need a higher dose of the medicine for pain relief. Tolerance is normal and is expected if you take the medicine for a long time. Do not suddenly stop taking your medicine because you may develop a severe reaction. Your body becomes used to the medicine. This does NOT mean you are addicted. Addiction is a behavior related to getting and using a drug for  a non-medical reason. If you have pain, you have a medical reason to take pain medicine. Your doctor will tell you how much medicine  to take. If your doctor wants you to stop the medicine, the dose will be slowly lowered over time to avoid any side effects. You may get drowsy or dizzy when you first start taking the medicine or change doses. Do not drive, use machinery, or do anything that may be dangerous until you know how the medicine affects you. Stand or sit up slowly. Do not take other medicines that contain acetaminophen with this medicine. Always read labels carefully. If you have questions, ask your doctor or pharmacist. If you take too much acetaminophen get medical help right away. Too much acetaminophen can be very dangerous and cause liver damage. Even if you do not have symptoms, it is important to get help right away. What side effects may I notice from receiving this medicine? Side effects that you should report to your doctor or health care professional as soon as possible: -allergic reactions like skin rash, itching or hives, swelling of the face, lips, or tongue -breathing problems -confusion -feeling faint or lightheaded, falls -redness, blistering, peeling or loosening of the skin, including inside the mouth -seizures -stomach pain -yellowing of the eyes or skin Side effects that usually do not require medical attention (report to your doctor or health care professional if they continue or are bothersome): -constipation -nausea, vomiting This list may not describe all possible side effects. Call your doctor for medical advice about side effects. You may report side effects to FDA at 1-800-FDA-1088. Where should I keep my medicine? Keep out of the reach of children. This medicine can be abused. Keep your medicine in a safe place to protect it from theft. Do not share this medicine with anyone. Selling or giving away this medicine is dangerous and against the law. This medicine may cause accidental overdose and death if it taken by other adults, children, or pets. Mix any unused medicine with a substance  like cat litter or coffee grounds. Then throw the medicine away in a sealed container like a sealed bag or a coffee can with a lid. Do not use the medicine after the expiration date. Store at room temperature between 20 and 25 degrees C (68 and 77 degrees F). Keep container tightly closed. Protect from light. NOTE: This sheet is a summary. It may not cover all possible information. If you have questions about this medicine, talk to your doctor, pharmacist, or health care provider.  2018 Elsevier/Gold Standard (2013-07-04 14:59:11)   Health Maintenance, Male A healthy lifestyle and preventive care is important for your health and wellness. Ask your health care provider about what schedule of regular examinations is right for you. What should I know about weight and diet? Eat a Healthy Diet  Eat plenty of vegetables, fruits, whole grains, low-fat dairy products, and lean protein.  Do not eat a lot of foods high in solid fats, added sugars, or salt.  Maintain a Healthy Weight Regular exercise can help you achieve or maintain a healthy weight. You should:  Do at least 150 minutes of exercise each week. The exercise should increase your heart rate and make you sweat (moderate-intensity exercise).  Do strength-training exercises at least twice a week.  Watch Your Levels of Cholesterol and Blood Lipids  Have your blood tested for lipids and cholesterol every 5 years starting at 28 years of age. If you are at high risk for  heart disease, you should start having your blood tested when you are 28 years old. You may need to have your cholesterol levels checked more often if: ? Your lipid or cholesterol levels are high. ? You are older than 28 years of age. ? You are at high risk for heart disease.  What should I know about cancer screening? Many types of cancers can be detected early and may often be prevented. Lung Cancer  You should be screened every year for lung cancer if: ? You are a  current smoker who has smoked for at least 30 years. ? You are a former smoker who has quit within the past 15 years.  Talk to your health care provider about your screening options, when you should start screening, and how often you should be screened.  Colorectal Cancer  Routine colorectal cancer screening usually begins at 28 years of age and should be repeated every 5-10 years until you are 28 years old. You may need to be screened more often if early forms of precancerous polyps or small growths are found. Your health care provider may recommend screening at an earlier age if you have risk factors for colon cancer.  Your health care provider may recommend using home test kits to check for hidden blood in the stool.  A small camera at the end of a tube can be used to examine your colon (sigmoidoscopy or colonoscopy). This checks for the earliest forms of colorectal cancer.  Prostate and Testicular Cancer  Depending on your age and overall health, your health care provider may do certain tests to screen for prostate and testicular cancer.  Talk to your health care provider about any symptoms or concerns you have about testicular or prostate cancer.  Skin Cancer  Check your skin from head to toe regularly.  Tell your health care provider about any new moles or changes in moles, especially if: ? There is a change in a mole's size, shape, or color. ? You have a mole that is larger than a pencil eraser.  Always use sunscreen. Apply sunscreen liberally and repeat throughout the day.  Protect yourself by wearing long sleeves, pants, a wide-brimmed hat, and sunglasses when outside.  What should I know about heart disease, diabetes, and high blood pressure?  If you are 4418-28 years of age, have your blood pressure checked every 3-5 years. If you are 640 years of age or older, have your blood pressure checked every year. You should have your blood pressure measured twice-once when you are at  a hospital or clinic, and once when you are not at a hospital or clinic. Record the average of the two measurements. To check your blood pressure when you are not at a hospital or clinic, you can use: ? An automated blood pressure machine at a pharmacy. ? A home blood pressure monitor.  Talk to your health care provider about your target blood pressure.  If you are between 7145-28 years old, ask your health care provider if you should take aspirin to prevent heart disease.  Have regular diabetes screenings by checking your fasting blood sugar level. ? If you are at a normal weight and have a low risk for diabetes, have this test once every three years after the age of 28. ? If you are overweight and have a high risk for diabetes, consider being tested at a younger age or more often.  A one-time screening for abdominal aortic aneurysm (AAA) by ultrasound is recommended for  men aged 32-75 years who are current or former smokers. What should I know about preventing infection? Hepatitis B If you have a higher risk for hepatitis B, you should be screened for this virus. Talk with your health care provider to find out if you are at risk for hepatitis B infection. Hepatitis C Blood testing is recommended for:  Everyone born from [birth year] through 1966.  Anyone with known risk factors for hepatitis C.  Sexually Transmitted Diseases (STDs)  You should be screened each year for STDs including gonorrhea and chlamydia if: ? You are sexually active and are younger than 27 years of age. ? You are older than 28 years of age and your health care provider tells you that you are at risk for this type of infection. ? Your sexual activity has changed since you were last screened and you are at an increased risk for chlamydia or gonorrhea. Ask your health care provider if you are at risk.  Talk with your health care provider about whether you are at high risk of being infected with HIV. Your health care provider  may recommend a prescription medicine to help prevent HIV infection.  What else can I do?  Schedule regular health, dental, and eye exams.  Stay current with your vaccines (immunizations).  Do not use any tobacco products, such as cigarettes, chewing tobacco, and e-cigarettes. If you need help quitting, ask your health care provider.  Limit alcohol intake to no more than 2 drinks per day. One drink equals 12 ounces of beer, 5 ounces of wine, or 1 ounces of hard liquor.  Do not use street drugs.  Do not share needles.  Ask your health care provider for help if you need support or information about quitting drugs.  Tell your health care provider if you often feel depressed.  Tell your health care provider if you have ever been abused or do not feel safe at home. This information is not intended to replace advice given to you by your health care provider. Make sure you discuss any questions you have with your health care provider. Document Released: 11/04/2007 Document Revised: 01/05/2016 Document Reviewed: 02/09/2015 Elsevier Interactive Patient Education  [employer].

## 2017-06-26 LAB — BASIC METABOLIC PANEL
BUN / CREAT RATIO: 13 (ref 9–20)
BUN: 11 mg/dL (ref 6–20)
CO2: 25 mmol/L (ref 20–29)
CREATININE: 0.86 mg/dL (ref 0.76–1.27)
Calcium: 10.1 mg/dL (ref 8.7–10.2)
Chloride: 102 mmol/L (ref 96–106)
GFR, EST AFRICAN AMERICAN: 137 mL/min/{1.73_m2} (ref >59)
GFR, EST NON AFRICAN AMERICAN: 119 mL/min/{1.73_m2} (ref >59)
Glucose: 96 mg/dL (ref 65–99)
Potassium: 4.4 mmol/L (ref 3.5–5.2)
Sodium: 145 mmol/L — ABNORMAL HIGH (ref 134–144)

## 2017-06-26 LAB — LIPID PANEL
CHOLESTEROL TOTAL: 170 mg/dL (ref 100–199)
Chol/HDL Ratio: 2.7 ratio (ref 0.0–5.0)
HDL: 64 mg/dL (ref >39)
LDL CALC: 94 mg/dL (ref 0–99)
TRIGLYCERIDES: 59 mg/dL (ref 0–149)
VLDL CHOLESTEROL CAL: 12 mg/dL (ref 5–40)

## 2017-06-26 LAB — HEMOGLOBIN A1C
Est. average glucose Bld gHb Est-mCnc: 108 mg/dL
Hgb A1c MFr Bld: 5.4 % (ref 4.8–5.6)

## 2017-06-26 LAB — TSH: TSH: 1.48 u[IU]/mL (ref 0.450–4.500)

## 2017-06-26 LAB — HIV ANTIBODY (ROUTINE TESTING W REFLEX): HIV Screen 4th Generation wRfx: NONREACTIVE

## 2017-06-27 ENCOUNTER — Encounter: Payer: Self-pay | Admitting: Family Medicine

## 2017-06-27 DIAGNOSIS — G44209 Tension-type headache, unspecified, not intractable: Secondary | ICD-10-CM | POA: Insufficient documentation

## 2017-06-27 NOTE — Progress Notes (Signed)
Subjective:    Patient ID: Shawn Werner, male    DOB: [birth year], 27 y.o.   MRN: [id number]  HPI  Shawn Werner, a 27 year old male that presents to establish care. He was a patient at [facility] and has been lost to follow up . Patient is requesting a complete physical exam. Patient is up to date on immunizations. He does not eat a balanced diet or exercise routinely. He has never had an eye exam. He says that he has not had a dental exam over the past several years. He is sexually active with 1 partner and does not use barrier protection. He was tested for sexually transmitted infections 1 year ago and has not changed partners over the past several years. Patient is a non smoker and rarely uses alcohol.   Patient is complaining of frequent headaches. Headaches are primarily frontal. Headaches are characterized as intermittent and aching. He states that headaches are not affected by light and sound. Headache pain does not radiate. Current headache pain is 1-2/10. He denies prodromal symptoms. Typical duration of headache is several hours. Most headaches are similar in presentation. He has not identified headache precipitants. Layne denies blurred vision, dizziness, abnormal gait, recent falls, paresthesias, chest pains, or heart palpitations. He has taken OTC analgesics with moderate relief.    Past Medical History:  Diagnosis Date  . Headache     Social History   Socioeconomic History  . Marital status: Single    Spouse name: Not on file  . Number of children: Not on file  . Years of education: Not on file  . Highest education level: Not on file  Social Needs  . Financial resource strain: Not on file  . Food insecurity - worry: Not on file  . Food insecurity - inability: Not on file  . Transportation needs - medical: Not on file  . Transportation needs - non-medical: Not on file  Occupational History  . Not on file  Tobacco Use  . Smoking status:  Never Smoker  . Smokeless tobacco: Never Used  Substance and Sexual Activity  . Alcohol use: Yes    Comment: occasionally  . Drug use: No  . Sexual activity: Not on file  Other Topics Concern  . Not on file  Social History Narrative  . Not on file    Social History   Socioeconomic History  . Marital status: Single    Spouse name: Not on file  . Number of children: Not on file  . Years of education: Not on file  . Highest education level: Not on file  Social Needs  . Financial resource strain: Not on file  . Food insecurity - worry: Not on file  . Food insecurity - inability: Not on file  . Transportation needs - medical: Not on file  . Transportation needs - non-medical: Not on file  Occupational History  . Not on file  Tobacco Use  . Smoking status: Never Smoker  . Smokeless tobacco: Never Used  Substance and Sexual Activity  . Alcohol use: Yes    Comment: occasionally  . Drug use: No  . Sexual activity: Not on file  Other Topics Concern  . Not on file  Social History Narrative  . Not on file   Immunization History  Administered Date(s) Administered  . Influenza,inj,Quad PF,6+ Mos 02/28/2016  . Tdap 02/28/2016    Review of Systems  Constitutional: Negative.  Negative for fatigue, fever and unexpected weight change (weight  gain).  HENT: Negative.   Respiratory: Negative.   Cardiovascular: Negative.  Negative for chest pain and leg swelling.  Endocrine: Negative.  Negative for polydipsia, polyphagia and polyuria.  Genitourinary: Negative.   Musculoskeletal: Negative.   Skin: Negative.   Neurological: Positive for headaches.  Hematological: Negative.   Psychiatric/Behavioral: Negative.        Objective:   Physical Exam  BP 123/83 (BP Location: Left Arm, Patient Position: Sitting, Cuff Size: Normal)   Pulse 74   Temp 98.4 F (36.9 C) (Oral)   Resp 16   Ht 5\' 10"  (1.778 m)   Wt 186 lb (84.4 kg)   SpO2 100%   BMI 26.69 kg/m   General Appearance:     Alert, cooperative, no distress, appears stated age  Head:    Normocephalic, without obvious abnormality, atraumatic  Eyes:    PERRL, conjunctiva/corneas clear, EOM's intact, fundi    benign, both eyes       Ears:    Normal TM's and external ear canals, both ears  Nose:   Nares normal, septum midline, mucosa normal, no drainage   or sinus tenderness  Throat:   Lips, mucosa, and tongue normal; teeth and gums normal  Neck:   Supple, symmetrical, trachea midline, no adenopathy;       thyroid:  No enlargement/tenderness/nodules; no carotid   bruit or JVD  Back:     Symmetric, no curvature, ROM normal, no CVA tenderness  Lungs:     Clear to auscultation bilaterally, respirations unlabored  Chest wall:    No tenderness or deformity  Heart:    Regular rate and rhythm, S1 and S2 normal, no murmur, rub   or gallop  Abdomen:     Soft, non-tender, bowel sounds active all four quadrants,    no masses, no organomegaly  Genitalia:    Uncircumcised, Normal male without lesion, discharge or tenderness  Extremities:   Extremities normal, atraumatic, no cyanosis or edema  Pulses:   2+ and symmetric all extremities  Skin:   Skin color, texture, turgor normal, no rashes or lesions  Lymph nodes:   Cervical, supraclavicular, and axillary nodes normal  Neurologic:   CNII-XII intact. Normal strength, sensation and reflexes      throughout        Assessment & Plan:  1. Physical exam, annual Recommend a lowfat, low carbohydrate diet divided over 5-6 small meals, increase water intake to 6-8 glasses, and 150 minutes per week of cardiovascular exercise.  Recommend monthly self testicular exam, utilized teach back method Recommend yearly eye exam Recommend establishing care with dentist for routine exam  - TSH - HIV antibody (with reflex) - Basic Metabolic Panel  2. Acute non intractable tension-type headache Will start a trial of Fioricet ever 6 hours as needed for moderate to severe headache pain.   Recommend that patient start a balanced diet and increase water intake.  - butalbital-acetaminophen-caffeine (FIORICET, ESGIC) 50-325-40 MG tablet; Take 1 tablet by mouth every 6 (six) hours as needed for headache.  Dispense: 30 tablet; Refill: 0   3. Screening for diabetes mellitus - Hemoglobin A1c 4. Screening cholesterol level  - Lipid Panel   RTC: 1 year for annual physical exam    The patient was given clear instructions to go to ER or return to medical center if symptoms do not improve, worsen or new problems develop. The patient verbalized understanding.     Nolon NationsLachina Moore Shenequa Howse  MSN, FNP-C [facility]  [address] [place], [place] [address] [phone]

## 2017-06-28 ENCOUNTER — Telehealth: Payer: Self-pay

## 2017-06-28 NOTE — Telephone Encounter (Signed)
-----   Message from Massie MaroonLachina M Hollis, [place]NP sent at 06/26/2017  2:42 PM EST ----- Regarding: Lab results Please inform patient that all walls are within normal range.  Please follow-up in office as scheduled.  Thanks

## 2017-06-28 NOTE — Telephone Encounter (Signed)
Called and spoke with patient, advised that all lab work was normal and to keep follow up as scheduled. Thanks!

## 2017-09-12 ENCOUNTER — Other Ambulatory Visit: Payer: Self-pay | Admitting: Family Medicine

## 2017-09-12 DIAGNOSIS — G44209 Tension-type headache, unspecified, not intractable: Secondary | ICD-10-CM

## 2017-09-13 NOTE — Telephone Encounter (Signed)
[place], Please advise if this can be refilled. Thanks!  

## 2017-09-17 MED FILL — CLINDAMYCIN PH 1% GEL: 1 | 15 days supply | Qty: 30 | Fill #0

## 2017-09-21 ENCOUNTER — Other Ambulatory Visit: Payer: Self-pay | Admitting: Family Medicine

## 2017-09-21 DIAGNOSIS — G44209 Tension-type headache, unspecified, not intractable: Secondary | ICD-10-CM

## 2017-09-21 NOTE — Telephone Encounter (Signed)
[place], Please advise if this can be refilled. Thanks!  

## 2017-09-25 MED FILL — BUTALB-ACETAMIN-CAFF 50-325: 50-325-40 | 7 days supply | Qty: 30 | Fill #0

## 2020-03-16 ENCOUNTER — Encounter: Payer: Self-pay | Admitting: Family Medicine

## 2020-03-30 ENCOUNTER — Encounter: Payer: Self-pay | Admitting: Family Medicine

## 2020-03-30 ENCOUNTER — Ambulatory Visit (INDEPENDENT_AMBULATORY_CARE_PROVIDER_SITE_OTHER): Payer: Self-pay | Admitting: Family Medicine

## 2020-03-30 ENCOUNTER — Other Ambulatory Visit: Payer: Self-pay

## 2020-03-30 VITALS — BP 131/86 | Temp 98.6°F | Resp 16 | Ht 69.0 in | Wt 197.6 lb

## 2020-03-30 DIAGNOSIS — Z0000 Encounter for general adult medical examination without abnormal findings: Secondary | ICD-10-CM

## 2020-03-30 DIAGNOSIS — Z23 Encounter for immunization: Secondary | ICD-10-CM

## 2020-03-30 DIAGNOSIS — L739 Follicular disorder, unspecified: Secondary | ICD-10-CM

## 2020-03-30 NOTE — Patient Instructions (Signed)

## 2020-03-30 NOTE — Progress Notes (Signed)
[facility]    Established Patient Office Visit  Subjective:  Patient ID: Shawn Werner, male    DOB: 05-23-1990  Age: 29 y.o. MRN: [id number]  CC: Possible penile lesions  HPI Shawn Werner is a 29 year old male that complains of possible penile lesion.  Shawn Werner has been lost to follow-up over the past several years.  Patient states that he has not had a physical exam.  He is not up-to-date with vaccination.  He is a non-smoker.  He drinks alcohol socially.  Patient does not wear sunscreen.  He wears a seatbelt.  Patient does not take daily multivitamin.  He is sexually active with one partner.  Penile rash or eruptions have been present over the past year.  Patient denies penile discharge, pain, or erectile dysfunction, erythema, or swelling.  Partner is not having any symptoms  Past Medical History:  Diagnosis Date  . Headache     No past surgical history on file.  No family history on file.  Social History   Socioeconomic History  . Marital status: Single    Spouse name: Not on file  . Number of children: Not on file  . Years of education: Not on file  . Highest education level: Not on file  Occupational History  . Not on file  Tobacco Use  . Smoking status: Never Smoker  . Smokeless tobacco: Never Used  Vaping Use  . Vaping Use: Never used  Substance and Sexual Activity  . Alcohol use: Yes    Comment: occasionally  . Drug use: No  . Sexual activity: Not on file  Other Topics Concern  . Not on file  Social History Narrative  . Not on file   Social Determinants of Health   Financial Resource Strain:   . Difficulty of Paying Living Expenses: Not on file  Food Insecurity:   . Worried About Programme researcher, broadcasting/film/video in the Last Year: Not on file  . Ran Out of Food in the Last Year: Not on file  Transportation Needs:   . Lack of Transportation (Medical): Not on file  . Lack of Transportation  (Non-Medical): Not on file  Physical Activity:   . Days of Exercise per Week: Not on file  . Minutes of Exercise per Session: Not on file  Stress:   . Feeling of Stress : Not on file  Social Connections:   . Frequency of Communication with Friends and Family: Not on file  . Frequency of Social Gatherings with Friends and Family: Not on file  . Attends Religious Services: Not on file  . Active Member of Clubs or Organizations: Not on file  . Attends Banker Meetings: Not on file  . Marital Status: Not on file  Intimate Partner Violence:   . Fear of Current or Ex-Partner: Not on file  . Emotionally Abused: Not on file  . Physically Abused: Not on file  . Sexually Abused: Not on file    Outpatient Medications Prior to Visit  Medication Sig Dispense Refill  . acetaminophen (TYLENOL) 325 MG tablet Take 650 mg by mouth every 6 (six) hours as needed for headache (pain).    . butalbital-acetaminophen-caffeine (FIORICET, ESGIC) 50-325-40 MG tablet TAKE 1 TABLET BY MOUTH EVERY 6 HOURS AS NEEDED FOR HEADACHES 30 tablet 0   No facility-administered medications prior to visit.    No Known Allergies  ROS Review of Systems  Constitutional: Negative for activity change and appetite change.  HENT: Negative.   Eyes: Negative.   Respiratory: Negative.   Cardiovascular: Negative.   Gastrointestinal: Negative.   Genitourinary: Positive for frequency. Negative for discharge, flank pain, hematuria and testicular pain.  Musculoskeletal: Negative.  Negative for arthralgias and back pain.  Skin: Negative.   Neurological: Negative.   Hematological: Negative.   Psychiatric/Behavioral: Negative.       Objective:    Physical Exam Constitutional:      Appearance: Normal appearance.  Eyes:     Pupils: Pupils are equal, round, and reactive to light.  Cardiovascular:     Pulses: Normal pulses.     Heart sounds: Normal heart sounds.  Pulmonary:     Effort: Pulmonary effort is  normal.     Breath sounds: Normal breath sounds.  Abdominal:     General: Abdomen is flat. Bowel sounds are normal.  Genitourinary:    Pubic Area: Rash present.     Comments: Erythema, small, round, flat, appears to be irritation of the hair follicle. Skin:    General: Skin is warm.  Neurological:     General: No focal deficit present.     Mental Status: He is alert. Mental status is at baseline.  Psychiatric:        Mood and Affect: Mood normal.        Behavior: Behavior normal.        Thought Content: Thought content normal.        Judgment: Judgment normal.     There were no vitals taken for this visit. Wt Readings from Last 3 Encounters:  06/25/17 186 lb (84.4 kg)  10/12/15 178 lb 12.8 oz (81.1 kg)  08/24/15 177 lb (80.3 kg)     Health Maintenance Due  Topic Date Due  . Hepatitis C Screening  Never done    There are no preventive care reminders to display for this patient.  Lab Results  Component Value Date   TSH 1.480 06/25/2017   Lab Results  Component Value Date   WBC 6.8 10/12/2015   HGB 14.8 10/12/2015   HCT 44.8 10/12/2015   MCV 88.4 10/12/2015   PLT 276 10/12/2015   Lab Results  Component Value Date   NA 145 (H) 06/25/2017   K 4.4 06/25/2017   CO2 25 06/25/2017   GLUCOSE 96 06/25/2017   BUN 11 06/25/2017   CREATININE 0.86 06/25/2017   BILITOT 0.4 01/21/2013   ALKPHOS 63 01/21/2013   AST 20 01/21/2013   ALT 10 01/21/2013   PROT 7.4 01/21/2013   ALBUMIN 4.1 01/21/2013   CALCIUM 10.1 06/25/2017   Lab Results  Component Value Date   CHOL 170 06/25/2017   Lab Results  Component Value Date   HDL 64 06/25/2017   Lab Results  Component Value Date   LDLCALC 94 06/25/2017   Lab Results  Component Value Date   TRIG 59 06/25/2017   Lab Results  Component Value Date   CHOLHDL 2.7 06/25/2017   Lab Results  Component Value Date   HGBA1C 5.4 06/25/2017      Assessment & Plan:   Problem List Items Addressed This Visit    None      Visit Diagnoses    Folliculitis    -  Primary   Annual physical exam       Relevant Orders   Lipid Panel (Completed)   Hemoglobin A1c (Completed)   Urinalysis, Routine w reflex microscopic (Completed)   CBC (Completed)   TSH (Completed)   Comprehensive metabolic  panel (Completed)   Need for influenza vaccination       Relevant Orders   Flu Vaccine QUAD 36+ mos IM (Fluarix, Fluzone & Afluria Quad PF (Completed)     Folliculitis Patient typically uses razor to shave and genital area.  Rash appears to be folliculitis.  Recommend electric shaver.  Also, patient can invest in an over-the-counter folliculitis cream.   Annual physical exam  - Lipid Panel - Hemoglobin A1c - Urinalysis, Routine w reflex microscopic - CBC - TSH - Comprehensive metabolic panel   Need for influenza vaccination - Flu Vaccine QUAD 36+ mos IM (Fluarix, Fluzone & Afluria Quad PF   Follow-up: Follow up in 1 year for annual physical with testicular exam  Anterrio Mccleery Rennis Petty  APRN, MSN, FNP-C [facility] [address] [place], [place] [address] [phone]

## 2020-03-31 LAB — COMPREHENSIVE METABOLIC PANEL
ALT: 31 IU/L (ref 0–44)
AST: 24 IU/L (ref 0–40)
Albumin/Globulin Ratio: 1.7 (ref 1.2–2.2)
Albumin: 5 g/dL (ref 4.1–5.2)
Alkaline Phosphatase: 80 IU/L (ref 44–121)
BUN/Creatinine Ratio: 8 — ABNORMAL LOW (ref 9–20)
BUN: 7 mg/dL (ref 6–20)
Bilirubin Total: 0.5 mg/dL (ref 0.0–1.2)
CO2: 27 mmol/L (ref 20–29)
Calcium: 10.1 mg/dL (ref 8.7–10.2)
Chloride: 101 mmol/L (ref 96–106)
Creatinine, Ser: 0.88 mg/dL (ref 0.76–1.27)
GFR calc Af Amer: 133 mL/min/{1.73_m2} (ref >59)
GFR calc non Af Amer: 115 mL/min/{1.73_m2} (ref >59)
Globulin, Total: 3 g/dL (ref 1.5–4.5)
Glucose: 100 mg/dL — ABNORMAL HIGH (ref 65–99)
Potassium: 4.6 mmol/L (ref 3.5–5.2)
Sodium: 141 mmol/L (ref 134–144)
Total Protein: 8 g/dL (ref 6.0–8.5)

## 2020-03-31 LAB — TSH: TSH: 0.803 u[IU]/mL (ref 0.450–4.500)

## 2020-03-31 LAB — URINALYSIS, ROUTINE W REFLEX MICROSCOPIC
Bilirubin, UA: NEGATIVE
Leukocytes,UA: NEGATIVE
Nitrite, UA: NEGATIVE
RBC, UA: NEGATIVE
Specific Gravity, UA: 1.024 (ref 1.005–1.030)
Urobilinogen, Ur: 0.2 mg/dL (ref 0.2–1.0)
pH, UA: 5.5 (ref 5.0–7.5)

## 2020-03-31 LAB — CBC
Hematocrit: 43.2 % (ref 37.5–51.0)
Hemoglobin: 14.5 g/dL (ref 13.0–17.7)
MCH: 31 pg (ref 26.6–33.0)
MCHC: 33.6 g/dL (ref 31.5–35.7)
MCV: 92 fL (ref 79–97)
Platelets: 256 10*3/uL (ref 150–450)
RBC: 4.68 x10E6/uL (ref 4.14–5.80)
RDW: 13.1 % (ref 11.6–15.4)
WBC: 4.7 10*3/uL (ref 3.4–10.8)

## 2020-03-31 LAB — LIPID PANEL
Chol/HDL Ratio: 2.9 ratio (ref 0.0–5.0)
Cholesterol, Total: 206 mg/dL — ABNORMAL HIGH (ref 100–199)
HDL: 70 mg/dL
LDL Chol Calc (NIH): 121 mg/dL — ABNORMAL HIGH (ref 0–99)
Triglycerides: 83 mg/dL (ref 0–149)
VLDL Cholesterol Cal: 15 mg/dL (ref 5–40)

## 2020-03-31 LAB — HEMOGLOBIN A1C
Est. average glucose Bld gHb Est-mCnc: 111 mg/dL
Hgb A1c MFr Bld: 5.5 % (ref 4.8–5.6)

## 2020-04-05 ENCOUNTER — Telehealth: Payer: Self-pay

## 2020-04-05 NOTE — Telephone Encounter (Signed)
-----   Message from Massie Maroon, [place] sent at 04/02/2020 12:08 PM EST ----- Regarding: lab results Please inform patient that total cholesterol is elevated at 206, goal is less than 200 and LDL is 106, goal is less than 100.  Overall, cholesterol is mildly elevated.  Recommend adjusting diet to low-fat, low carbohydrate diet divided over small meals throughout the day.  Also, increase exercise to about 150 minutes/week.  We will recheck cholesterol levels in 6 months.  If they continue to be elevated, will consider adding medication at that time.  All other labs were unremarkable and consistent with a normal range.   Nolon Nations  APRN, MSN, FNP-C Patient Care [facility] [address] [place], [place] [address] [phone]

## 2021-03-29 ENCOUNTER — Ambulatory Visit: Payer: Self-pay | Admitting: Family Medicine

## 2021-04-26 ENCOUNTER — Encounter: Payer: Self-pay | Admitting: Family Medicine

## 2021-04-26 ENCOUNTER — Ambulatory Visit (INDEPENDENT_AMBULATORY_CARE_PROVIDER_SITE_OTHER): Payer: Self-pay | Admitting: Family Medicine

## 2021-04-26 ENCOUNTER — Other Ambulatory Visit: Payer: Self-pay

## 2021-04-26 VITALS — BP 129/80 | HR 110 | Temp 98.3°F | Ht 69.0 in | Wt 193.0 lb

## 2021-04-26 DIAGNOSIS — Z0000 Encounter for general adult medical examination without abnormal findings: Secondary | ICD-10-CM

## 2021-04-26 DIAGNOSIS — G44209 Tension-type headache, unspecified, not intractable: Secondary | ICD-10-CM

## 2021-04-26 DIAGNOSIS — Z23 Encounter for immunization: Secondary | ICD-10-CM

## 2021-04-26 LAB — POCT GLYCOSYLATED HEMOGLOBIN (HGB A1C): Hemoglobin A1C: 5.6 % (ref 4.0–5.6)

## 2021-04-26 MED ORDER — BUTALBITAL-APAP-CAFFEINE 50-325-40 MG PO TABS: ORAL_TABLET | ORAL | 0 refills | Status: AC

## 2021-04-26 NOTE — Progress Notes (Signed)
[facility]   Subjective:  Shawn Werner is a 30 y.o. male who presents for annual wellness visit.  Patient states that he has been doing well and is without complaint.  Patient is up-to-date with yearly eye exams and typically goes to dentist twice a year.  He is sexually active, uses barrier protection.  He does not do monthly self testicular exams.  Patient wears sunscreen occasionally.  He wears a seatbelt. Lab Results  Component Value Date   CHOL 206 (H) 03/30/2020   HDL 70 03/30/2020   LDLCALC 121 (H) 03/30/2020   TRIG 83 03/30/2020   CHOLHDL 2.9 03/30/2020    Last A1C in the office was:  Lab Results  Component Value Date   HGBA1C 5.5 03/30/2020   Patient is on Vitamin D supplement.   No results found for: VD25OH     Names of Other Physician/Practitioners you currently use: 1. [facility] 2. Patient Care Team: Massie Maroon, FNP as PCP - General (Family Medicine)   Medication Review: Current Outpatient Medications on File Prior to Visit  Medication Sig Dispense Refill   acetaminophen (TYLENOL) 325 MG tablet Take 650 mg by mouth every 6 (six) hours as needed for headache (pain). (Patient not taking: Reported on 04/26/2021)     No current facility-administered medications on file prior to visit.    Current Problems (verified) Patient Active Problem List   Diagnosis Date Noted   Acute non intractable tension-type headache 06/27/2017   Physical exam, annual 06/25/2017    Screening Tests Health Maintenance  Topic Date Due   Hepatitis C Screening  Never done   TETANUS/TDAP  02/27/2026   HIV Screening  Completed   Pneumococcal Vaccine 17-48 Years old  Aged Out   HPV VACCINES  Aged Out    Immunization History  Administered Date(s) Administered   Influenza,inj,Quad PF,6+ Mos 02/28/2016, 03/30/2020   Tdap 02/28/2016    Allergies as of 04/26/2021   No Known Allergies       Medication List        Accurate as of April 26, 2021 10:14 AM. If you have any questions, ask your nurse or doctor.          acetaminophen 325 MG tablet Commonly known as: TYLENOL Take 650 mg by mouth every 6 (six) hours as needed for headache (pain).   butalbital-acetaminophen-caffeine 50-325-40 MG tablet Commonly known as: FIORICET TAKE 1 TABLET BY MOUTH EVERY 6 HOURS AS NEEDED FOR HEADACHES        No past surgical history on file. Family History  Problem Relation Age of Onset   Hypertension Father     History reviewed: allergies, current medications, past family history, past medical history, past social history, past surgical history and problem list   Tobacco Social History   Tobacco Use   Smoking status: Never   Smokeless tobacco: Never  Vaping Use   Vaping Use: Never used  Substance Use Topics   Alcohol use: Yes    Comment: occasionally   Drug use: No   He does not smoke.   Alcohol Current alcohol use: social drinker  Caffeine Current caffeine use: coffee 1 /day  Exercise Current exercise: aerobics  Nutrition/Diet Current diet: in general, a "healthy" diet    Cardiac risk factors: none.  Depression Screen   Activities of Daily Living   Are you sexually active?  Yes  Do you have more than one partner?  No  Vision Difficulties: No  Advanced directives Does patient have a Health Care Power of Attorney? No Does patient have a Living Will? No   Objective:     Blood pressure 129/80, pulse (!) 110, temperature 98.3 F (36.8 C), height 5\' 9"  (1.753 m), weight 193 lb 0.4 oz (87.6 kg), SpO2 98 %. Body mass index is 28.5 kg/m.  General appearance: alert, no distress, WD/WN, male   HEENT: normocephalic, sclerae anicteric, TMs pearly, nares patent, no discharge or erythema, pharynx normal Oral cavity: MMM, no lesions Neck: supple, no lymphadenopathy, no thyromegaly, no masses Heart: RRR, normal S1, S2, no murmurs Lungs: CTA  bilaterally, no wheezes, rhonchi, or rales Abdomen: +bs, soft, non tender, non distended, no masses, no hepatomegaly, no splenomegaly Musculoskeletal: nontender, no swelling, no obvious deformity Extremities: no edema, no cyanosis, no clubbing Pulses: 2+ symmetric, upper and lower extremities, normal cap refill Neurological: alert, oriented x 3, CN2-12 intact, strength normal upper extremities and lower extremities, sensation normal throughout, DTRs 2+ throughout, no cerebellar signs, gait normal Testicular: B/L testicle descended. No masses Psychiatric: normal affect, behavior normal, pleasant   Assessment:  Patient denies any difficulties at home.  Is UTD with immunizations. Is UTD with screening. Discussed Advanced Directives, patient agrees to bring copies of documents if can. Encouraged heart healthy diet, exercise as tolerated and adequate sleep. Declines flu shot.   Plan:   During the course of the visit the patient was educated and counseled about appropriate screening and preventive services including:    Influenza vaccine Td vaccine Diabetes screening Nutrition counseling  Advanced directives: requested  Screening recommendations, referrals: Vaccinations: Recommended yearly ophthalmology/optometry visit screening and checkup Recommended yearly dental visit for hygiene and checkup Advanced directives - requested    The patient's weight, height, BMI, and visual acuity have been recorded in the chart.  I have provided counseling, and provided education to the patient based on review of the above.  [place]  APRN, MSN, FNP-C Patient Care [facility] [address] [place], [place] [phone]

## 2021-04-27 LAB — CBC WITH DIFFERENTIAL/PLATELET
Basophils Absolute: 0.1 10*3/uL (ref 0.0–0.2)
Basos: 1 %
EOS (ABSOLUTE): 0 10*3/uL (ref 0.0–0.4)
Eos: 0 %
Hematocrit: 44.3 % (ref 37.5–51.0)
Hemoglobin: 15.3 g/dL (ref 13.0–17.7)
Immature Grans (Abs): 0 10*3/uL (ref 0.0–0.1)
Immature Granulocytes: 0 %
Lymphocytes Absolute: 1.7 10*3/uL (ref 0.7–3.1)
Lymphs: 30 %
MCH: 31.2 pg (ref 26.6–33.0)
MCHC: 34.5 g/dL (ref 31.5–35.7)
MCV: 90 fL (ref 79–97)
Monocytes Absolute: 0.2 10*3/uL (ref 0.1–0.9)
Monocytes: 4 %
Neutrophils Absolute: 3.6 10*3/uL (ref 1.4–7.0)
Neutrophils: 65 %
Platelets: 322 10*3/uL (ref 150–450)
RBC: 4.91 x10E6/uL (ref 4.14–5.80)
RDW: 13.1 % (ref 11.6–15.4)
WBC: 5.6 10*3/uL (ref 3.4–10.8)

## 2021-04-27 LAB — HEPATITIS C ANTIBODY: Hep C Virus Ab: 0.2 s/co ratio (ref 0.0–0.9)

## 2021-04-27 LAB — COMPREHENSIVE METABOLIC PANEL
ALT: 28 IU/L (ref 0–44)
AST: 24 IU/L (ref 0–40)
Albumin/Globulin Ratio: 1.7 (ref 1.2–2.2)
Albumin: 5.1 g/dL — ABNORMAL HIGH (ref 4.0–5.0)
Alkaline Phosphatase: 89 IU/L (ref 44–121)
BUN/Creatinine Ratio: 6 — ABNORMAL LOW (ref 9–20)
BUN: 5 mg/dL — ABNORMAL LOW (ref 6–20)
Bilirubin Total: 0.3 mg/dL (ref 0.0–1.2)
CO2: 22 mmol/L (ref 20–29)
Calcium: 10.1 mg/dL (ref 8.7–10.2)
Chloride: 102 mmol/L (ref 96–106)
Creatinine, Ser: 0.83 mg/dL (ref 0.76–1.27)
Globulin, Total: 3 g/dL (ref 1.5–4.5)
Glucose: 119 mg/dL — ABNORMAL HIGH (ref 70–99)
Potassium: 4.1 mmol/L (ref 3.5–5.2)
Sodium: 141 mmol/L (ref 134–144)
Total Protein: 8.1 g/dL (ref 6.0–8.5)
eGFR: 120 mL/min/{1.73_m2} (ref >59)

## 2021-04-27 LAB — TSH: TSH: 0.693 u[IU]/mL (ref 0.450–4.500)

## 2022-05-02 ENCOUNTER — Telehealth: Payer: Self-pay

## 2022-05-02 ENCOUNTER — Ambulatory Visit: Payer: Self-pay | Admitting: Family Medicine

## 2022-05-02 NOTE — Telephone Encounter (Signed)
Called pt to advise that he can reschedule for his cpe with any of our providers. No answer lvm

## 2023-02-13 ENCOUNTER — Ambulatory Visit (INDEPENDENT_AMBULATORY_CARE_PROVIDER_SITE_OTHER): Payer: Self-pay | Admitting: Family Medicine

## 2023-02-13 ENCOUNTER — Encounter: Payer: Self-pay | Admitting: Family Medicine

## 2023-02-13 VITALS — BP 134/91 | HR 81 | Temp 98.5°F | Resp 12 | Ht 69.0 in | Wt 208.0 lb

## 2023-02-13 DIAGNOSIS — Z0000 Encounter for general adult medical examination without abnormal findings: Secondary | ICD-10-CM

## 2023-02-13 DIAGNOSIS — Z23 Encounter for immunization: Secondary | ICD-10-CM

## 2023-02-13 NOTE — Progress Notes (Signed)
ANNUAL PREVENTATIVE VISIT AND CPE  Subjective:  Shawn Werner is a 32 y.o. male that presents for annual wellness visit.  Patient states that he has been doing well and is without complaint.  Patient does not eat a balanced diet or exercise consistently.  The patient wears sunscreen.  He wears a seatbelt.  He is up-to-date with dental exams, he typically goes twice per year.  Patient has not been evaluated by ophthalmologist.  He is sexually active with 1 partner. Lab Results  Component Value Date   CHOL 206 (H) 03/30/2020   HDL 70 03/30/2020   LDLCALC 121 (H) 03/30/2020   TRIG 83 03/30/2020   CHOLHDL 2.9 03/30/2020   Lab Results  Component Value Date   HGBA1C 5.6 04/26/2021   Patient is on Vitamin D supplement.   No results found for: "VD25OH"      Patient Care Team: Massie Maroon, FNP as PCP - General (Family Medicine)   Medication Review: Current Outpatient Medications on File Prior to Visit  Medication Sig Dispense Refill   acetaminophen (TYLENOL) 325 MG tablet Take 650 mg by mouth every 6 (six) hours as needed for headache (pain). (Patient not taking: Reported on 04/26/2021)     butalbital-acetaminophen-caffeine (FIORICET) 50-325-40 MG tablet TAKE 1 TABLET BY MOUTH EVERY 6 HOURS AS NEEDED FOR HEADACHES (Patient not taking: Reported on 02/13/2023) 30 tablet 0   No current facility-administered medications on file prior to visit.    Current Problems (verified) Patient Active Problem List   Diagnosis Date Noted   Acute non intractable tension-type headache 06/27/2017   Physical exam, annual 06/25/2017    Screening Tests Health Maintenance  Topic Date Due   COVID-19 Vaccine (1 - 2023-24 season) Never done   DTaP/Tdap/Td (2 - Td or Tdap) 02/27/2026   Hepatitis C Screening  Completed   HIV Screening  Completed   HPV VACCINES  Aged Out    Immunization History  Administered Date(s) Administered   Influenza,inj,Quad PF,6+ Mos 02/28/2016, 03/30/2020   Tdap  02/28/2016    This medication is not covered by your insurance if dispensed from your Physician's office. You can obtain your vaccine at an area Pharmacy.  Allergies as of 02/13/2023   No Known Allergies      Medication List        Accurate as of February 13, 2023 10:37 AM. If you have any questions, ask your nurse or doctor.          acetaminophen 325 MG tablet Commonly known as: TYLENOL Take 650 mg by mouth every 6 (six) hours as needed for headache (pain).   butalbital-acetaminophen-caffeine 50-325-40 MG tablet Commonly known as: FIORICET TAKE 1 TABLET BY MOUTH EVERY 6 HOURS AS NEEDED FOR HEADACHES        No past surgical history on file. Family History  Problem Relation Age of Onset   Hypertension Father     History reviewed: allergies, current medications, past family history, past medical history, past social history, past surgical history and problem list    Tobacco Social History   Tobacco Use   Smoking status: Never   Smokeless tobacco: Never  Vaping Use   Vaping status: Never Used  Substance Use Topics   Alcohol use: Yes    Comment: occasionally   Drug use: No   He does not smoke.  Patient is not a former smoker. Are there smokers in your home (other than you)?  No  Alcohol Current alcohol use: social drinker Exercise Current exercise:  none  Nutrition/Diet Current diet: in general, an "unhealthy" diet  Cardiac risk factors: obesity (BMI >= 30 kg/m2).  Depression Screen (Note: if answer to either of the following is "Yes", a more complete depression screening is indicated)   Q1: Over the past two weeks, have you felt down, depressed or hopeless? No  Q2: Over the past two weeks, have you felt little interest or pleasure in doing things? No  Have you lost interest or pleasure in daily life? No  Do you often feel hopeless? No  Do you cry easily over simple problems? No   Are you sexually active?  Yes  Do you have more than one  partner?  No  Vision Difficulties: No   Objective:     Blood pressure (!) 134/91, pulse 81, temperature 98.5 F (36.9 C), resp. rate 12, height 5\' 9"  (1.753 m), weight 208 lb (94.3 kg), SpO2 100%. Body mass index is 30.72 kg/m.  General appearance: alert, no distress, WD/WN, male  HEENT: normocephalic, sclerae anicteric, TMs pearly, nares patent, no discharge or erythema, pharynx normal Oral cavity: MMM, no lesions Neck: supple, no lymphadenopathy, no thyromegaly, no masses Heart: RRR, normal S1, S2, no murmurs Lungs: CTA bilaterally, no wheezes, rhonchi, or rales Abdomen: +bs, soft, non tender, non distended, no masses, no hepatomegaly, no splenomegaly Musculoskeletal: nontender, no swelling, no obvious deformity Extremities: no edema, no cyanosis, no clubbing Pulses: 2+ symmetric, upper and lower extremities, normal cap refill Neurological: alert, oriented x 3, CN2-12 intact, strength normal upper extremities and lower extremities, sensation normal throughout, DTRs 2+ throughout, no cerebellar signs, gait normal Testicular: B/L testicle descended. No masses Psychiatric: normal affect, behavior normal, pleasant   Assessment:   1. Routine physical examination  - CBC with Differential - TSH - CMP and Liver - Hemoglobin A1c - Urinalysis, Routine w reflex microscopic - Lipid Panel - Flu Vaccine QUAD 36+ mos IM (Fluarix, Fluzone & Afluria Quad PF  2. Influenza vaccination given  - Flu Vaccine QUAD 36+ mos IM (Fluarix, Fluzone & Afluria Quad PF   Plan:   During the course of the visit the patient was educated and counseled about appropriate screening and preventive services including:  Screening recommendations, referrals: Vaccinations: Please see documentation below and orders this visit. Recommended yearly ophthalmology/optometry visit for glaucoma screening and checkup Recommended yearly dental visit for hygiene and checkup Advanced directives - requested  The  patient's weight, height, BMI, and visual acuity have been recorded in the chart.  I have made referrals, counseling, and provided education to the patient based on review of the above and I have provided the patient with a written personalized care plan for preventive services.    Nolon Nations  APRN, MSN, FNP-C Patient Care [facility] [address] [place], [place] [address] [phone]

## 2023-02-14 LAB — URINALYSIS, ROUTINE W REFLEX MICROSCOPIC
Bilirubin, UA: NEGATIVE
Glucose, UA: NEGATIVE
Ketones, UA: NEGATIVE
Leukocytes,UA: NEGATIVE
Nitrite, UA: NEGATIVE
Protein,UA: NEGATIVE
RBC, UA: NEGATIVE
Specific Gravity, UA: 1.006 (ref 1.005–1.030)
Urobilinogen, Ur: 0.2 mg/dL (ref 0.2–1.0)
pH, UA: 8 — ABNORMAL HIGH (ref 5.0–7.5)

## 2023-02-14 LAB — LIPID PANEL
Chol/HDL Ratio: 2.8 ratio (ref 0.0–5.0)
Cholesterol, Total: 224 mg/dL — ABNORMAL HIGH (ref 100–199)
HDL: 79 mg/dL (ref >39)
LDL Chol Calc (NIH): 126 mg/dL — ABNORMAL HIGH (ref 0–99)
Triglycerides: 111 mg/dL (ref 0–149)
VLDL Cholesterol Cal: 19 mg/dL (ref 5–40)

## 2023-02-14 LAB — HEMOGLOBIN A1C
Est. average glucose Bld gHb Est-mCnc: 114 mg/dL
Hgb A1c MFr Bld: 5.6 % (ref 4.8–5.6)

## 2023-02-14 LAB — CBC WITH DIFFERENTIAL/PLATELET
Basophils Absolute: 0.1 10*3/uL (ref 0.0–0.2)
Basos: 2 %
EOS (ABSOLUTE): 0.2 10*3/uL (ref 0.0–0.4)
Eos: 3 %
Hematocrit: 43.9 % (ref 37.5–51.0)
Hemoglobin: 14.7 g/dL (ref 13.0–17.7)
Immature Grans (Abs): 0 10*3/uL (ref 0.0–0.1)
Immature Granulocytes: 0 %
Lymphocytes Absolute: 2.7 10*3/uL (ref 0.7–3.1)
Lymphs: 43 %
MCH: 31.6 pg (ref 26.6–33.0)
MCHC: 33.5 g/dL (ref 31.5–35.7)
MCV: 94 fL (ref 79–97)
Monocytes Absolute: 0.4 10*3/uL (ref 0.1–0.9)
Monocytes: 7 %
Neutrophils Absolute: 2.9 10*3/uL (ref 1.4–7.0)
Neutrophils: 45 %
Platelets: 260 10*3/uL (ref 150–450)
RBC: 4.65 x10E6/uL (ref 4.14–5.80)
RDW: 12.6 % (ref 11.6–15.4)
WBC: 6.4 10*3/uL (ref 3.4–10.8)

## 2023-02-14 LAB — CMP AND LIVER
ALT: 64 IU/L — ABNORMAL HIGH (ref 0–44)
AST: 49 IU/L — ABNORMAL HIGH (ref 0–40)
Albumin: 4.8 g/dL (ref 4.1–5.1)
Alkaline Phosphatase: 73 IU/L (ref 44–121)
BUN: 6 mg/dL (ref 6–20)
Bilirubin Total: 0.3 mg/dL (ref 0.0–1.2)
Bilirubin, Direct: 0.15 mg/dL (ref 0.00–0.40)
CO2: 23 mmol/L (ref 20–29)
Calcium: 9.9 mg/dL (ref 8.7–10.2)
Chloride: 100 mmol/L (ref 96–106)
Creatinine, Ser: 0.8 mg/dL (ref 0.76–1.27)
Glucose: 100 mg/dL — ABNORMAL HIGH (ref 70–99)
Potassium: 4.2 mmol/L (ref 3.5–5.2)
Sodium: 139 mmol/L (ref 134–144)
Total Protein: 7.7 g/dL (ref 6.0–8.5)
eGFR: 120 mL/min/{1.73_m2} (ref >59)

## 2023-02-14 LAB — TSH: TSH: 0.854 u[IU]/mL (ref 0.450–4.500)

## 2023-02-21 NOTE — Progress Notes (Signed)
The patient was seen for a complete physical exam.  During exam, patient's liver enzymes were slightly elevated.  In addition total cholesterol was 224, goal is less than 200, and LDL cholesterol was 126, goal is less than 100.  Recommend a low-fat, low carbohydrate diet divided over small meals throughout the day.  Also recommend 150 minutes of low impact cardiovascular exercise per week.  Please schedule a follow-up appointment for this patient in 3 months to repeat fasting cholesterol panel and complete metabolic panel.  All other labs were unremarkable.  Nolon Nations  APRN, MSN, FNP-C Patient Care [facility] [address] [place], [place] [address] [phone]

## 2023-06-05 ENCOUNTER — Ambulatory Visit: Payer: Self-pay | Admitting: Nurse Practitioner

## 2023-06-05 ENCOUNTER — Ambulatory Visit: Payer: Self-pay | Admitting: Family Medicine

## 2023-08-14 ENCOUNTER — Ambulatory Visit: Payer: Self-pay | Admitting: Family Medicine

## 2024-01-10 ENCOUNTER — Encounter: Payer: Self-pay | Admitting: Nurse Practitioner

## 2024-01-10 ENCOUNTER — Other Ambulatory Visit: Payer: Self-pay

## 2024-01-10 ENCOUNTER — Ambulatory Visit (INDEPENDENT_AMBULATORY_CARE_PROVIDER_SITE_OTHER): Payer: Self-pay | Admitting: Nurse Practitioner

## 2024-01-10 VITALS — BP 123/73 | HR 87 | Temp 98.1°F | Wt 199.8 lb

## 2024-01-10 DIAGNOSIS — G4700 Insomnia, unspecified: Secondary | ICD-10-CM

## 2024-01-10 DIAGNOSIS — Z1329 Encounter for screening for other suspected endocrine disorder: Secondary | ICD-10-CM

## 2024-01-10 DIAGNOSIS — Z13220 Encounter for screening for lipoid disorders: Secondary | ICD-10-CM

## 2024-01-10 DIAGNOSIS — Z0000 Encounter for general adult medical examination without abnormal findings: Secondary | ICD-10-CM

## 2024-01-10 MED ORDER — HYDROXYZINE PAMOATE 25 MG PO CAPS
25.0000 mg | ORAL_CAPSULE | Freq: Three times a day (TID) | ORAL | 0 refills | Status: AC | PRN
  Filled 2024-01-10: qty 30, 10d supply, fill #0

## 2024-01-10 NOTE — Progress Notes (Signed)
 Subjective   Patient ID: Shawn Werner, male    DOB: [birth year], 33 y.o.   MRN: [id number]  Chief Complaint  Patient presents with   Annual Exam    Referring provider: Oley Bascom RAMAN, NP  Viviano Bir is a 33 y.o. male with Past Medical History: No date: Headache   HPI  Shawn Werner is a 33 y.o. male that presents for annual wellness visit.  Patient states that he has been doing well and is without complaint.  Patient does not eat a balanced diet or exercise consistently.  The patient wears sunscreen.  He wears a seatbelt.  He is up-to-date with dental exams, he typically goes twice per year.  Patient has been evaluated by ophthalmologist.  Denies f/c/s, n/v/d, hemoptysis, PND, leg swelling. Denies chest pain or edema.  Note: Patient states that he recently quit drinking.  He is having trouble sleeping at night.  We will trial hydroxyzine .  We did discuss that we can get him help with rehab.  Patient declines at this time.  We will follow-up with him in 1 month to see how he is doing.   No Known Allergies  Immunization History  Administered Date(s) Administered   Influenza,inj,Quad PF,6+ Mos 02/28/2016, 03/30/2020   Tdap 02/28/2016    Tobacco History: Social History   Tobacco Use  Smoking Status Never  Smokeless Tobacco Never   Counseling given: Not Answered   Outpatient Encounter Medications as of 01/10/2024  Medication Sig   hydrOXYzine  (VISTARIL ) 25 MG capsule Take 1 capsule (25 mg total) by mouth every 8 (eight) hours as needed.   acetaminophen  (TYLENOL ) 325 MG tablet Take 650 mg by mouth every 6 (six) hours as needed for headache (pain). (Patient not taking: Reported on 04/26/2021)   butalbital -acetaminophen -caffeine  (FIORICET) 50-325-40 MG tablet TAKE 1 TABLET BY MOUTH EVERY 6 HOURS AS NEEDED FOR HEADACHES (Patient not taking: Reported on 02/13/2023)   No facility-administered encounter medications on file as of 01/10/2024.    Review of  Systems  Review of Systems  Constitutional: Negative.   HENT: Negative.    Cardiovascular: Negative.   Gastrointestinal: Negative.   Allergic/Immunologic: Negative.   Neurological: Negative.   Psychiatric/Behavioral: Negative.       Objective:   BP 123/73   Pulse 87   Temp 98.1 F (36.7 C) (Oral)   Wt 199 lb 12.8 oz (90.6 kg)   SpO2 98%   BMI 29.51 kg/m   Wt Readings from Last 5 Encounters:  01/10/24 199 lb 12.8 oz (90.6 kg)  02/13/23 208 lb (94.3 kg)  04/26/21 193 lb 0.4 oz (87.6 kg)  03/30/20 197 lb 9.6 oz (89.6 kg)  06/25/17 186 lb (84.4 kg)     Physical Exam Vitals and nursing note reviewed.  Constitutional:      General: He is not in acute distress.    Appearance: He is well-developed.  Cardiovascular:     Rate and Rhythm: Normal rate and regular rhythm.  Pulmonary:     Effort: Pulmonary effort is normal.     Breath sounds: Normal breath sounds.  Skin:    General: Skin is warm and dry.  Neurological:     Mental Status: He is alert and oriented to person, place, and time.       Assessment & Plan:   Insomnia, unspecified type -     hydrOXYzine  Pamoate; Take 1 capsule (25 mg total) by mouth every 8 (eight) hours as needed.  Dispense: 30 capsule; Refill: 0  Routine adult  health maintenance -     CBC -     Comprehensive metabolic panel with GFR  Lipid screening -     Lipid panel  Thyroid disorder screen -     TSH     Return in about 4 weeks (around 02/07/2024).     Bascom GORMAN Borer, NP 01/10/2024

## 2024-01-10 NOTE — Patient Instructions (Signed)
Alcohol Misuse and Dependence Information, Adult Alcohol is a widely available drug and people choose to drink alcohol in different amounts. Alcohol misuse and dependence can have a negative effect on your life. Alcohol misuse is when you use alcohol too much or too often. You may have a hard time setting a limit on the amount you drink. Alcohol dependence is when you use alcohol consistently for a period of time, and your body changes as a result. Alcohol dependence can make it hard for you to stop drinking because you may start to feel sick or different when you do not drink alcohol. These symptoms are known as withdrawal. People who drink alcohol very often and in large amounts, may develop what is called an alcohol use disorder. How can alcohol misuse and dependence affect me? Drinking too much can lead to addiction. You may feel like you need alcohol to function normally. You may drink alcohol before work in the morning, during the day, or as soon as you get home from work in the evening. These actions can result in: Poor work performance. Job loss. Financial problems. Car crashes or criminal charges from driving after drinking alcohol. Problems in your relationships with friends and family. Losing the trust and respect of coworkers, friends, and family. Drinking heavily over a long period of time can permanently damage your body and brain, and can cause lifelong health issues, such as: Damage to your liver or pancreas. Heart problems, high blood pressure, or stroke. Certain cancers. Decreased ability to fight infections. Brain or nerve damage. Depression. Early death, also called premature death. If you are careless or you crave alcohol, it is easy to drink more than your body can handle (overdose). Alcohol overdose is a serious situation that requires hospitalization. It may lead to permanent injuries or death. What can increase my risk? Having a family history of alcohol misuse. Having  depression or other mental health conditions. Beginning to drink at an early age. Binge drinking often. Experiencing trauma, stress, and an unstable home life during childhood. Spending time with people who drink often. What actions can I take to prevent alcohol misuse and dependence? Do not drink alcohol if: Your health care provider tells you not to drink. You are pregnant, may be pregnant, or are planning to become pregnant. If you drink alcohol: Limit how much you have to: 0-1 drink a day for women who are not pregnant. 0-2 drinks a day for men. Know how much alcohol is in your drink. In the [place] one drink equals one 12 oz bottle of beer (355 mL), one 5 oz glass of wine (148 mL), or one 1 oz glass of hard liquor (44 mL). If you think you have an alcohol dependency problem, decide to stop drinking. This can be very hard to do if you are used to frequently drinking alcohol. If you begin to have withdrawal symptoms, talk with your health care provider or a person that you trust. These symptoms may include anxiety, shaky hands, headache, nausea, sweating, or not being able to sleep. Choose to drink nonalcoholic beverages in social gatherings and places where there may be alcohol. Activity Spend more time on activities that you enjoy that do not involve alcohol, like hobbies or exercise. Find healthy ways to cope with stress, such as meditation or spending time with people you care about. General information Talk to your family, coworkers, and friends about supporting you in your efforts to stop drinking. If they drink, ask them not to drink  around you. Spend more time with people who do not drink alcohol. If you think that you have an alcohol dependency problem: Tell friends or family about your concerns. Talk with your health care provider or another health professional about where to get help. Work with a Paramedic and a Network engineer. Consider joining a support group  for people who struggle with alcohol misuse and dependence. Where to find support  Your health care provider. SMART Recovery: smartrecovery.org Local treatment centers or chemical dependency counselors. Local AA groups in your community: CustomizedRugs.fi Where to find more information Centers for Disease Control and Prevention: TonerPromos.[employer] on Alcohol Abuse and Alcoholism: BackupSupply.hu Alcoholics Anonymous (AA): CustomizedRugs.fi Contact a health care provider if: You drank more or for longer than you intended on more than one occasion. You often drink to the point of vomiting or passing out. You have problems in your life due to drinking, but you continue to drink. You keep drinking even though you feel anxious, depressed, or have experienced memory loss. You have stopped doing the things you used to enjoy in order to drink. You have to drink more than you used to in order to get the effect you want. You experience anxiety, sweating, nausea, shakiness, and trouble sleeping when you try to stop drinking. Get help right away if: You have serious withdrawal symptoms, including: Confusion. Racing heart. High blood pressure. Fever. These symptoms may be an emergency. Get help right away. Call 911. Do not wait to see if the symptoms will go away. Do not drive yourself to the hospital. Also, get help right away if: You have thoughts about hurting yourself or others. Take one of these steps if you feel like you may hurt yourself or others, or have thoughts about taking your own life: Call 911. Call the National Suicide Prevention Lifeline at 703-677-7233 or 988. This is open 24 hours a day. Text the Crisis Text Line at (563)032-9389. Summary Alcohol misuse and dependence can have a negative effect on your life. Drinking too much or too often can lead to addiction. If you drink alcohol, limit how much you use. If you are having trouble keeping your drinking under control, find ways to change your  behavior. Hobbies, calming activities, exercise, or support groups can help. If you feel you need help with changing your drinking habits, talk with your health care provider, a good friend, or a therapist, or go to a support group. This information is not intended to replace advice given to you by your health care provider. Make sure you discuss any questions you have with your health care provider. Document Revised: 07/13/2021 Document Reviewed: 07/13/2021 Elsevier Patient Education  2024 [employer].

## 2024-01-11 ENCOUNTER — Ambulatory Visit: Payer: Self-pay | Admitting: Nurse Practitioner

## 2024-01-11 LAB — LIPID PANEL
Chol/HDL Ratio: 3.4 ratio (ref 0.0–5.0)
Cholesterol, Total: 195 mg/dL (ref 100–199)
HDL: 57 mg/dL (ref >39)
LDL Chol Calc (NIH): 120 mg/dL — ABNORMAL HIGH (ref 0–99)
Triglycerides: 103 mg/dL (ref 0–149)
VLDL Cholesterol Cal: 18 mg/dL (ref 5–40)

## 2024-01-11 LAB — COMPREHENSIVE METABOLIC PANEL WITH GFR
ALT: 72 IU/L — ABNORMAL HIGH (ref 0–44)
AST: 44 IU/L — ABNORMAL HIGH (ref 0–40)
Albumin: 4.8 g/dL (ref 4.1–5.1)
Alkaline Phosphatase: 64 IU/L (ref 44–121)
BUN/Creatinine Ratio: 12 (ref 9–20)
BUN: 11 mg/dL (ref 6–20)
Bilirubin Total: 0.5 mg/dL (ref 0.0–1.2)
CO2: 23 mmol/L (ref 20–29)
Calcium: 10 mg/dL (ref 8.7–10.2)
Chloride: 101 mmol/L (ref 96–106)
Creatinine, Ser: 0.95 mg/dL (ref 0.76–1.27)
Globulin, Total: 2.4 g/dL (ref 1.5–4.5)
Glucose: 130 mg/dL — ABNORMAL HIGH (ref 70–99)
Potassium: 4 mmol/L (ref 3.5–5.2)
Sodium: 139 mmol/L (ref 134–144)
Total Protein: 7.2 g/dL (ref 6.0–8.5)
eGFR: 108 mL/min/1.73 (ref >59)

## 2024-01-11 LAB — CBC
Hematocrit: 41.6 % (ref 37.5–51.0)
Hemoglobin: 13.6 g/dL (ref 13.0–17.7)
MCH: 31.1 pg (ref 26.6–33.0)
MCHC: 32.7 g/dL (ref 31.5–35.7)
MCV: 95 fL (ref 79–97)
Platelets: 252 x10E3/uL (ref 150–450)
RBC: 4.38 x10E6/uL (ref 4.14–5.80)
RDW: 12.2 % (ref 11.6–15.4)
WBC: 7.3 x10E3/uL (ref 3.4–10.8)

## 2024-01-11 LAB — TSH: TSH: 0.624 u[IU]/mL (ref 0.450–4.500)

## 2024-02-08 ENCOUNTER — Ambulatory Visit (INDEPENDENT_AMBULATORY_CARE_PROVIDER_SITE_OTHER): Payer: Self-pay | Admitting: Nurse Practitioner

## 2024-02-08 VITALS — BP 123/84 | HR 75 | Temp 98.1°F | Wt 204.0 lb

## 2024-02-08 DIAGNOSIS — F1029 Alcohol dependence with unspecified alcohol-induced disorder: Secondary | ICD-10-CM

## 2024-02-08 NOTE — Progress Notes (Signed)
   Subjective   Patient ID: Shawn Werner, male    DOB: 1991-05-14, 33 y.o.   MRN: [id number]  Chief Complaint  Patient presents with   Medical Management of Chronic Issues    Referring provider: Oley Bascom RAMAN, NP  [name] is a 33 y.o. male with Past Medical History: No date: Headache   HPI  Patient presents today for follow-up visit.  Overall has been doing well.  He is continuing to cut back on drinking.  He states that he is sleeping okay at night with hydroxyzine .  We discussed that he will need repeat labs at next visit.   No Known Allergies  Immunization History  Administered Date(s) Administered   Influenza,inj,Quad PF,6+ Mos 02/28/2016, 03/30/2020   Tdap 02/28/2016    Tobacco History: Social History   Tobacco Use  Smoking Status Never  Smokeless Tobacco Never   Counseling given: Not Answered   Outpatient Encounter Medications as of 02/08/2024  Medication Sig   hydrOXYzine  (VISTARIL ) 25 MG capsule Take 1 capsule (25 mg total) by mouth every 8 (eight) hours as needed.   acetaminophen  (TYLENOL ) 325 MG tablet Take 650 mg by mouth every 6 (six) hours as needed for headache (pain). (Patient not taking: Reported on 04/26/2021)   butalbital -acetaminophen -caffeine  (FIORICET) 50-325-40 MG tablet TAKE 1 TABLET BY MOUTH EVERY 6 HOURS AS NEEDED FOR HEADACHES (Patient not taking: Reported on 02/13/2023)   No facility-administered encounter medications on file as of 02/08/2024.    Review of Systems  Review of Systems  Constitutional: Negative.   HENT: Negative.    Cardiovascular: Negative.   Gastrointestinal: Negative.   Allergic/Immunologic: Negative.   Neurological: Negative.   Psychiatric/Behavioral: Negative.       Objective:   BP 123/84   Pulse 75   Temp 98.1 F (36.7 C) (Oral)   Wt 204 lb (92.5 kg)   SpO2 98%   BMI 30.13 kg/m   Wt Readings from Last 5 Encounters:  02/08/24 204 lb (92.5 kg)  01/10/24 199 lb 12.8 oz (90.6 kg)   02/13/23 208 lb (94.3 kg)  04/26/21 193 lb 0.4 oz (87.6 kg)  03/30/20 197 lb 9.6 oz (89.6 kg)     Physical Exam Vitals and nursing note reviewed.  Constitutional:      General: He is not in acute distress.    Appearance: He is well-developed.  Cardiovascular:     Rate and Rhythm: Normal rate and regular rhythm.  Pulmonary:     Effort: Pulmonary effort is normal.     Breath sounds: Normal breath sounds.  Skin:    General: Skin is warm and dry.  Neurological:     Mental Status: He is alert and oriented to person, place, and time.       Assessment & Plan:   Alcohol dependence with unspecified alcohol-induced disorder (HCC)    Please quit drinking.  Discussed with patient that we do have rehab options available if needed.  Return in about 3 months (around 05/09/2024).   [name], NP 02/18/2024

## 2024-02-18 ENCOUNTER — Encounter: Payer: Self-pay | Admitting: Nurse Practitioner

## 2024-05-09 ENCOUNTER — Ambulatory Visit: Payer: Self-pay | Admitting: Nurse Practitioner

## 2024-06-06 ENCOUNTER — Ambulatory Visit: Payer: Self-pay | Admitting: Nurse Practitioner

## 2024-06-09 ENCOUNTER — Encounter: Payer: Self-pay | Admitting: Nurse Practitioner

## 2024-07-25 ENCOUNTER — Ambulatory Visit: Payer: Self-pay | Admitting: Nurse Practitioner
